# Patient Record
Sex: Female | Born: 1950 | Race: White | Hispanic: No | Marital: Married | Smoking: Never smoker
Health system: Southern US, Community
[De-identification: ages and names within clinical notes are randomized; demographics above are authoritative.]

## PROBLEM LIST (undated history)

## (undated) DIAGNOSIS — M26629 Arthralgia of temporomandibular joint, unspecified side: Secondary | ICD-10-CM

## (undated) DIAGNOSIS — K589 Irritable bowel syndrome without diarrhea: Secondary | ICD-10-CM

## (undated) DIAGNOSIS — N200 Calculus of kidney: Secondary | ICD-10-CM

## (undated) DIAGNOSIS — T7840XA Allergy, unspecified, initial encounter: Secondary | ICD-10-CM

## (undated) DIAGNOSIS — B009 Herpesviral infection, unspecified: Secondary | ICD-10-CM

## (undated) DIAGNOSIS — N3010 Interstitial cystitis (chronic) without hematuria: Secondary | ICD-10-CM

## (undated) HISTORY — DX: Arthralgia of temporomandibular joint, unspecified side: M26.629

## (undated) HISTORY — DX: Herpesviral infection, unspecified: B00.9

## (undated) HISTORY — PX: REDUCTION MAMMAPLASTY: SUR839

## (undated) HISTORY — DX: Interstitial cystitis (chronic) without hematuria: N30.10

## (undated) HISTORY — PX: ABDOMINAL HYSTERECTOMY: SHX81

## (undated) HISTORY — DX: Irritable bowel syndrome, unspecified: K58.9

## (undated) HISTORY — PX: BREAST ENHANCEMENT SURGERY: SHX7

## (undated) HISTORY — DX: Calculus of kidney: N20.0

## (undated) HISTORY — DX: Allergy, unspecified, initial encounter: T78.40XA

## (undated) HISTORY — PX: AUGMENTATION MAMMAPLASTY: SUR837

---

## 1994-10-09 HISTORY — PX: MICRODISCECTOMY LUMBAR: SUR864

## 2001-07-08 ENCOUNTER — Other Ambulatory Visit: Admission: RE | Admit: 2001-07-08 | Discharge: 2001-07-08 | Payer: Self-pay | Admitting: Obstetrics & Gynecology

## 2002-03-02 ENCOUNTER — Encounter: Payer: Self-pay | Admitting: Internal Medicine

## 2002-03-02 ENCOUNTER — Encounter: Admission: RE | Admit: 2002-03-02 | Discharge: 2002-03-02 | Payer: Self-pay | Admitting: Internal Medicine

## 2002-07-12 ENCOUNTER — Other Ambulatory Visit: Admission: RE | Admit: 2002-07-12 | Discharge: 2002-07-12 | Payer: Self-pay | Admitting: Obstetrics & Gynecology

## 2002-08-30 ENCOUNTER — Encounter: Admission: RE | Admit: 2002-08-30 | Discharge: 2002-08-30 | Payer: Self-pay | Admitting: Plastic Surgery

## 2002-08-30 ENCOUNTER — Encounter: Payer: Self-pay | Admitting: Plastic Surgery

## 2003-04-26 ENCOUNTER — Ambulatory Visit (HOSPITAL_COMMUNITY): Admission: RE | Admit: 2003-04-26 | Discharge: 2003-04-26 | Payer: Self-pay | Admitting: Internal Medicine

## 2003-04-26 ENCOUNTER — Encounter: Payer: Self-pay | Admitting: Internal Medicine

## 2003-08-09 ENCOUNTER — Other Ambulatory Visit: Admission: RE | Admit: 2003-08-09 | Discharge: 2003-08-09 | Payer: Self-pay | Admitting: Obstetrics and Gynecology

## 2003-08-17 ENCOUNTER — Encounter: Admission: RE | Admit: 2003-08-17 | Discharge: 2003-08-17 | Payer: Self-pay | Admitting: Obstetrics & Gynecology

## 2004-04-30 ENCOUNTER — Encounter (INDEPENDENT_AMBULATORY_CARE_PROVIDER_SITE_OTHER): Payer: Self-pay | Admitting: *Deleted

## 2004-04-30 ENCOUNTER — Observation Stay (HOSPITAL_COMMUNITY): Admission: AD | Admit: 2004-04-30 | Discharge: 2004-05-01 | Payer: Self-pay | Admitting: Obstetrics & Gynecology

## 2004-08-28 ENCOUNTER — Encounter: Admission: RE | Admit: 2004-08-28 | Discharge: 2004-08-28 | Payer: Self-pay | Admitting: Internal Medicine

## 2004-12-09 ENCOUNTER — Other Ambulatory Visit: Admission: RE | Admit: 2004-12-09 | Discharge: 2004-12-09 | Payer: Self-pay | Admitting: Obstetrics & Gynecology

## 2005-08-29 ENCOUNTER — Encounter: Admission: RE | Admit: 2005-08-29 | Discharge: 2005-08-29 | Payer: Self-pay | Admitting: Obstetrics & Gynecology

## 2006-09-03 ENCOUNTER — Encounter: Admission: RE | Admit: 2006-09-03 | Discharge: 2006-09-03 | Payer: Self-pay | Admitting: Internal Medicine

## 2006-09-03 ENCOUNTER — Encounter: Admission: RE | Admit: 2006-09-03 | Discharge: 2006-09-03 | Payer: Self-pay | Admitting: Obstetrics & Gynecology

## 2007-04-13 ENCOUNTER — Other Ambulatory Visit: Admission: RE | Admit: 2007-04-13 | Discharge: 2007-04-13 | Payer: Self-pay | Admitting: Internal Medicine

## 2007-07-05 ENCOUNTER — Emergency Department (HOSPITAL_COMMUNITY): Admission: EM | Admit: 2007-07-05 | Discharge: 2007-07-05 | Payer: Self-pay | Admitting: Emergency Medicine

## 2007-09-09 HISTORY — PX: COLONOSCOPY: SHX174

## 2007-09-20 ENCOUNTER — Encounter: Admission: RE | Admit: 2007-09-20 | Discharge: 2007-09-20 | Payer: Self-pay | Admitting: Obstetrics & Gynecology

## 2008-02-22 ENCOUNTER — Ambulatory Visit: Payer: Self-pay | Admitting: Gastroenterology

## 2008-03-06 ENCOUNTER — Ambulatory Visit: Payer: Self-pay | Admitting: Gastroenterology

## 2008-11-07 ENCOUNTER — Encounter: Admission: RE | Admit: 2008-11-07 | Discharge: 2008-11-07 | Payer: Self-pay | Admitting: Obstetrics & Gynecology

## 2008-12-21 ENCOUNTER — Ambulatory Visit: Payer: Self-pay | Admitting: Internal Medicine

## 2009-12-03 ENCOUNTER — Encounter: Admission: RE | Admit: 2009-12-03 | Discharge: 2009-12-03 | Payer: Self-pay | Admitting: Internal Medicine

## 2010-06-27 ENCOUNTER — Ambulatory Visit: Payer: Self-pay | Admitting: Internal Medicine

## 2010-10-31 ENCOUNTER — Ambulatory Visit (INDEPENDENT_AMBULATORY_CARE_PROVIDER_SITE_OTHER): Payer: BC Managed Care – PPO | Admitting: Internal Medicine

## 2010-10-31 DIAGNOSIS — L509 Urticaria, unspecified: Secondary | ICD-10-CM

## 2010-11-21 ENCOUNTER — Other Ambulatory Visit: Payer: Self-pay | Admitting: Internal Medicine

## 2010-11-21 DIAGNOSIS — Z1231 Encounter for screening mammogram for malignant neoplasm of breast: Secondary | ICD-10-CM

## 2010-12-10 ENCOUNTER — Ambulatory Visit: Payer: BC Managed Care – PPO

## 2010-12-16 ENCOUNTER — Ambulatory Visit
Admission: RE | Admit: 2010-12-16 | Discharge: 2010-12-16 | Disposition: A | Discharge status: Home / Self Care | Payer: BC Managed Care – PPO | Source: Ambulatory Visit | Attending: Internal Medicine | Admitting: Internal Medicine

## 2010-12-16 DIAGNOSIS — Z1231 Encounter for screening mammogram for malignant neoplasm of breast: Secondary | ICD-10-CM

## 2011-01-24 NOTE — Discharge Summary (Signed)
NAMEDORINNE, GRAEFF                            ACCOUNT NO.:  [id number]   MEDICAL RECORD NO.:  [id number]                   PATIENT TYPE:  OBV   LOCATION:  9317                                 FACILITY:  [facility]   PHYSICIAN:  Freddy Finner, M.D.                DATE OF BIRTH:  1951/05/22   DATE OF ADMISSION:  04/30/2004  DATE OF DISCHARGE:  05/01/2004                                 DISCHARGE SUMMARY   DISCHARGE DIAGNOSES:  1.  Extensive uterine adenomyosis.  2.  Serosal uterine endometriosis.  3.  Small uterine leiomyoma.   OPERATIVE PROCEDURE:  1.  Laparoscopically-assisted vaginal hysterectomy.  2.  Bilateral salpingo-oophorectomy.   POSTOPERATIVE COMPLICATIONS:  None.   DISPOSITION:  The patient is in satisfactory condition at the time of her  discharge.  She is to have progressively increasing physical activity but no  vaginal entry.  No heavy lifting.  She is report having vaginal bleeding or  severe pain or fever.  She is to return to the office in approximately 2  weeks for postoperative followup.  She is to take a regular diet.  She is  given Vivelle 0.1 dot for estrogen replacement.  She is given Percocet 5/325  to be taken as needed for postoperative pain along with Ibuprofen as needed.   Details of the present illness, past history, family history, review of  systems, and physical examination recorded in the Admission Note.   Her physical findings on admission were primarily remarkable for the uterine  enlargement of 8-9 weeks' gestational size.  Her history is remarkable for  persistent irregular menometrorrhagia on hormone replacement therapy and  family history of ovarian cancer with recent postmenopausal ovarian cyst  __________ .   HOSPITAL COURSE:  Following admission, the patient was taken to the  operating room.  She was treated perioperatively with compression hose and  IV antibiotic.  The above-described operative procedure was accomplished  without  difficulty or intraoperative complications.  The patient's  postoperative course was entirely satisfactory without complications.  The  laboratory data during this admission included an admission hemoglobin of  13.2.  Postoperative hemoglobin of 11.3.  Admission prothrombin time, PTT,  and urinalysis were all normal.  On the afternoon of the first postoperative  day, her condition was considered to be good.  She was discharged home with  disposition as noted above.                                               Freddy Finner, M.D.    WRN/MEDQ  D:  05/19/2004  T:  05/20/2004  Job:  [id number]

## 2011-01-24 NOTE — Op Note (Signed)
NAME:  Gabriela Green, Gabriela Green                            ACCOUNT NO.:  [id number]   MEDICAL RECORD NO.:  [id number]                   PATIENT TYPE:  OBV   LOCATION:  9399                                 [facility]   PHYSICIAN:  Freddy Finner, M.D.                DATE OF BIRTH:  02/02/1951   DATE OF PROCEDURE:  04/30/2004  DATE OF DISCHARGE:                                 OPERATIVE REPORT   PREOPERATIVE DIAGNOSES:  1. Uterine fibroids.  2. Persistent dysfunctional uterine bleeding on hormone replacement therapy.  3. Recurring evidence of postmenopausal ovarian cyst.  4. Family history of ovarian cancer.   POSTOPERATIVE DIAGNOSES:  1. Uterine fibroids.  2. Persistent dysfunctional uterine bleeding on hormone replacement therapy.  3. Recurring evidence of postmenopausal ovarian cyst.  4. Family history of ovarian cancer.   OPERATIVE PROCEDURE:  Laparoscopic-assisted vaginal hysterectomy, bilateral  salpingo-oophorectomy.   SURGEON:  Freddy Finner, M.D.   ASSISTANT:  Dineen Kid. Rana Snare, M.D.   ESTIMATED INTRAOPERATIVE BLOOD LOSS:  200 mL.   INTRAOPERATIVE COMPLICATIONS:  None.   The patient is a 60 year old married female who was admitted on the morning  of surgery.  She received a gram of Rocephin IV.  She was placed in PAS  hose.  She was brought to the operating room, there placed under adequate  general anesthesia, placed in the dorsal lithotomy position.  The abdomen,  perineum, and vagina were prepped in the usual fashion using Betadine scrub  followed by Betadine solution.  A Hulka tenaculum was attached to the cervix  under direct visualization without difficulty.  Sterile drapes were applied.  Two small incisions were made - one at the umbilicus, one just above the  symphysis.  The anterior abdominal wall was elevated manually and an 11 mm  nonbladed disposable trocar introduced at the umbilicus.  Direct inspection  revealed adequate placement with no evidence of injury on  entry.  Pneumoperitoneum was allowed to accumulate with carbon dioxide gas.  A 5 mm  trocar was placed through a small midline lower suprapubic incision.  Through it a blunt probe and later the grasping spring-loaded forceps were  used.  Systematic examination of pelvic and abdominal contents was carried  out.  The uterus was enlarged to approximately 8-[redacted] weeks gestational size.  Tubes and ovaries were normal.  There was no evidence of peritoneal disease.  The cecum was very redundant and appendix was visualized and was normal.  There was no apparent abnormality of liver or any other obvious abnormality  in the upper abdomen.  Findings were recorded in photographs which were  retained in the office record.  Using the tripolar Gyrus device and the  spring-loaded grasping forceps, the adnexa on the right was grasped and  placed under traction.  The infundibulopelvic ligament, round ligament,  upper broad ligament were progressively developed, sealed, and divided with  the Gyrus  device.  This was carried to a level just above the uterine  arteries.  The left side was treated then in an identical fashion.  Attention was then turned vaginally.  A posterior weighted vaginal retractor  was placed.  Deavers were used to retract the anterior and lateral vaginal  walls.  The cervix was grasped with a Jacobs tenaculum after removing the  Hulka tenaculum.  Colpotomy incision was made posteriorly while tenting the  mucosa posterior to the cervix.  The cervix was circumscribed with a scalpel  to release the mucosa and bladder.  The Gyrus Heaney-type clamp was then  used to seal and divide the uterosacrals on each side and then the bladder  pillars on each side.  The bladder was further advanced off the cervix, the  anterior peritoneum was entered, and this was confirmed by careful  inspection.  The Gyrus device was used to seal and then divide the cardinal  pedicles and the vessel pedicles.  This  essentially completely released the  uterus.  The angles of the vagina were then anchored to the uterosacrals  with mattress suture of 0 Monocryl.  The uterosacrals were plicated and  posterior peritoneum closed with an interrupted 0 Monocryl suture.  The cuff  was closed vertically in figure-of-eights of 0 Monocryl.  A Foley catheter  was placed.  Reinspection laparoscopically was then carried out using the  Nezhat irrigation system.  All pedicles were carefully evaluated and found  to be completely dry after irrigation and inspection under irrigation.  The  instruments were then removed, all the irrigating solution was aspirated,  the gas was allowed to escape from the abdomen.  The skin incisions were  closed with interrupted subcuticular sutures of 3-0 Dexon, Steri-Strips were  applied to the lower incision.  Marcaine 0.5% with epinephrine was injected  into each incision site for postoperative analgesia; a total of  approximately 9 mL of this solution was used.  The patient was awakened,  taken to the recovery room in good condition.                                               Freddy Finner, M.D.    WRN/MEDQ  D:  04/30/2004  T:  04/30/2004  Job:  [id number]

## 2011-01-24 NOTE — H&P (Signed)
NAME:  Gabriela Green, Gabriela Green                            ACCOUNT NO.:  [id number]   MEDICAL RECORD NO.:  [id number]                   PATIENT TYPE:  OBV   LOCATION:  NA                                   [facility]   PHYSICIAN:  Freddy Finner, M.D.                DATE OF BIRTH:  1950/12/21   DATE OF ADMISSION:  04/30/2004  DATE OF DISCHARGE:                                HISTORY & PHYSICAL   ADMITTING DIAGNOSES:  1. Uterine leiomyomata.  2. Persistent postmenopausal bleeding on hormone replacement therapy.  3. Family history of ovarian cancer with recent postmenopausal ovarian cyst.   HISTORY OF PRESENT ILLNESS:  Patient is a 60 year old white married female  gravida 4, para 2, living 2 who has been followed in the office since the  early 1980s.  She has been known to have uterine leiomyomata since at least  2001.  She has been on hormone replacement therapy for the last 4 years.  Various forms have been used including Vivelle skin patch with Prometrium  and more recently Activella.  She has had intermittent episodes of  postmenopausal bleeding with the hormone replacement therapy.  This has  failed to respond to cyclic hormonal therapy.  With the positive family  history of ovarian cancer, recent postmenopausal findings and ovarian cysts,  the patient has requested definitive surgical intervention.  She is admitted  now for laparoscopically assisted vaginal hysterectomy and bilateral  salpingo-oophorectomy.   REVIEW OF SYSTEMS:  Her current review of systems is otherwise negative.  She has no cardiopulmonary, GI, or GU complaints.   PAST MEDICAL HISTORY:  Patient has no known significant medical illnesses.  Episodically she has been felt to have mitral valve prolapse which has never  been definitively evaluated.  She has no other medical conditions.  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  She is not a  cigarette smoker.  She only occasionally uses alcohol.  She has never been a  smoker.  She has never had a blood transfusion.   FAMILY HISTORY:  Family history is remarkable for ovarian cancer, mother had  tuberculosis; no other documented significant family history.   PHYSICAL EXAMINATION:  HEENT:  Grossly within normal limits.  NECK:  Thyroid gland is not palpably enlarged.  VITAL SIGNS:  Blood pressure in the office is 100/60, weight is 141, height  5 feet 8 inches.  HEART:  Normal sinus rhythm without murmurs, rubs, or gallops heard to my  examination at preop physical.  LUNGS:  Lungs are clear to auscultation.  BREAST:  Breast exam is normal and remarkable for breast augmentation, there  are no discrete palpable masses, no skin change, no nipple discharge.  ABDOMEN:  Soft and nontender without appreciable organomegaly or palpable  masses.  PELVIC:  External genitalia, vagina, cervix are normal.  Bimanual reveals  the uterus to be retroverted in position approximately 8-[redacted] weeks gestational  size, there are no palpable adnexal masses at preoperative exam.  Rectum is  palpably normal and rectovaginal exam confirmed these findings.  EXTREMITIES:  Without cyanosis, clubbing, or edema.   ULTRASOUND:  Ultrasound in the office in March 2005 did show definite  uterine leiomyomata.   MAMMOGRAM:  Most recent mammogram was in December 2004 at the [facility]  of [place] and it was felt to be normal.   ASSESSMENT:  1. Uterine leiomyomata.  2. Ovarian cyst episodically in a postmenopausal female.  3. Family history of ovarian cancer.   PLAN:  Laparoscopically assisted vaginal hysterectomy/bilateral salpingo-  oophorectomy.  The patient has reviewed a video describing the operative  procedure and is now admitted for surgery.  She will be treated  perioperatively with IV antibiotic with compression hose for thromboembolic  prophylaxis.                                                Freddy Finner, M.D.    WRN/MEDQ  D:  04/29/2004  T:  04/29/2004  Job:  [id number]

## 2011-05-02 ENCOUNTER — Encounter: Payer: Self-pay | Admitting: Internal Medicine

## 2011-05-06 ENCOUNTER — Encounter: Payer: BC Managed Care – PPO | Admitting: Internal Medicine

## 2011-05-20 ENCOUNTER — Encounter: Payer: Self-pay | Admitting: Internal Medicine

## 2011-05-20 ENCOUNTER — Ambulatory Visit (INDEPENDENT_AMBULATORY_CARE_PROVIDER_SITE_OTHER): Payer: BC Managed Care – PPO | Admitting: Internal Medicine

## 2011-05-20 VITALS — BP 112/62 | HR 68 | Temp 97.6°F | Ht 68.0 in | Wt 145.0 lb

## 2011-05-20 DIAGNOSIS — Z0000 Encounter for general adult medical examination without abnormal findings: Secondary | ICD-10-CM

## 2011-05-20 DIAGNOSIS — Z23 Encounter for immunization: Secondary | ICD-10-CM

## 2011-05-20 LAB — POCT URINALYSIS DIPSTICK
Glucose, UA: NEGATIVE
Leukocytes, UA: NEGATIVE
Protein, UA: NEGATIVE
Urobilinogen, UA: NEGATIVE

## 2011-05-20 LAB — CBC WITH DIFFERENTIAL/PLATELET
Eosinophils Absolute: 0.3 10*3/uL (ref 0.0–0.7)
Eosinophils Relative: 7 % — ABNORMAL HIGH (ref 0–5)
Hemoglobin: 13.3 g/dL (ref 12.0–15.0)
Lymphs Abs: 1.5 10*3/uL (ref 0.7–4.0)
MCH: 31.4 pg (ref 26.0–34.0)
MCV: 92.9 fL (ref 78.0–100.0)
Monocytes Relative: 9 % (ref 3–12)
Platelets: 305 10*3/uL (ref 150–400)
RBC: 4.23 MIL/uL (ref 3.87–5.11)
WBC: 4.9 10*3/uL (ref 4.0–10.5)

## 2011-05-20 LAB — COMPREHENSIVE METABOLIC PANEL
Albumin: 4.1 g/dL (ref 3.5–5.2)
BUN: 8 mg/dL (ref 6–23)
CO2: 25 mEq/L (ref 19–32)
Calcium: 9.1 mg/dL (ref 8.4–10.5)
Chloride: 104 mEq/L (ref 96–112)
Creat: 0.65 mg/dL (ref 0.50–1.10)
Potassium: 4.3 mEq/L (ref 3.5–5.3)

## 2011-05-20 LAB — LIPID PANEL
HDL: 70 mg/dL (ref >39)
LDL Cholesterol: 97 mg/dL (ref 0–99)
Total CHOL/HDL Ratio: 2.7 Ratio

## 2011-05-21 LAB — VITAMIN D 25 HYDROXY (VIT D DEFICIENCY, FRACTURES): Vit D, 25-Hydroxy: 20 ng/mL — ABNORMAL LOW (ref 30–89)

## 2011-05-23 ENCOUNTER — Other Ambulatory Visit: Payer: Self-pay | Admitting: Dermatology

## 2011-06-04 DIAGNOSIS — M8580 Other specified disorders of bone density and structure, unspecified site: Secondary | ICD-10-CM | POA: Insufficient documentation

## 2011-06-04 NOTE — Progress Notes (Deleted)
  Subjective:    Patient ID: Gabriela Green, female    DOB: 1951-01-09, 60 y.o.   MRN: [id number]  HPI 60 year old white female with history of hypertension and anxiety for health maintenance. Had Zostavax vaccine August 2011, bone density may 2009, Cardiolite study 2006. GYN physician is Dr. Eda Paschal. Patient had mammogram August 2011. History of depression 1998. Takes HCTZ for mild hypertension with good control. Keep her weight under control and exercises. Is retired from [employer]. and lives in [place] with her [place] . 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 Benna Dunks in 2010. Needs to consider bone density in the near future. Last bone density study showed a T score of the femoral neck -2.4 and left hip -1.3. At that time recommended maximizing calcium and vitamin D intake.  Family history uncle with coronary artery disease, mother with coronary artery disease and dementia. Father died at age 23 with a stroke. Patient has no history of serious illnesses accidents or operations. No known drug allergies.    Review of Systems  Constitutional: Negative.   HENT: Negative.   Eyes: Negative.   Respiratory: Negative.   Gastrointestinal: Negative.   Genitourinary: Negative.   Musculoskeletal: Negative.   Neurological: Negative.   Hematological: Negative.   Psychiatric/Behavioral: Negative.        Objective:   Physical Exam  Vitals reviewed. Constitutional: She is oriented to person, place, and time. She appears well-nourished. No distress.  HENT:  Head: Normocephalic and atraumatic.  Right Ear: External ear normal.  Left Ear: External ear normal.  Mouth/Throat: Oropharynx is clear and moist.  Eyes: Conjunctivae and EOM are normal. Pupils are equal, round, and reactive to light. Right eye exhibits no  discharge. Left eye exhibits no discharge. No scleral icterus.  Neck: Neck supple. No JVD present. No thyromegaly present.  Cardiovascular: Normal rate, regular rhythm, normal heart sounds and intact distal pulses.   No murmur heard. Pulmonary/Chest: Effort normal and breath sounds normal. She has no wheezes. She has no rales.  Abdominal: She exhibits no distension and no mass. There is no tenderness. There is no rebound and no guarding.  Genitourinary:       Deferred to GYN  Musculoskeletal: Normal range of motion. She exhibits no edema.  Lymphadenopathy:    She has no cervical adenopathy.  Neurological: She is alert and oriented to person, place, and time. She has normal reflexes. No cranial nerve deficit.  Skin: Skin is warm and dry. No rash noted.  Psychiatric: She has a normal mood and affect. Her behavior is normal. Judgment and thought content normal.          Assessment & Plan:  Hypertension  Anxiety  Remote history of depression  Osteopenia  Patient is to return in one year or as needed. Consider bone density study next year. Continue calcium and vitamin D supplementation.

## 2011-06-09 ENCOUNTER — Encounter: Payer: Self-pay | Admitting: Internal Medicine

## 2011-06-09 NOTE — Progress Notes (Signed)
  Subjective:    Patient ID: Gabriela Green, female    DOB: [birth year], 60 y.o.   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 History of allergic rhinitis. 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 Takes calcium and multivitamin supplements. Has never smoked. Social alcohol consumption. Have breast implants 1998. Hysterectomy and BSO August 2005. 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 Had tetanus immunization 2004. Colonoscopy by Dr. Jarold Motto 2009. Mammogram April 2012.  Divorced with 2 adult daughters from her first marriage.  Family history: Father died with pneumonia. 2 brothers and 2 sisters. One sister with diabetes.    Review of Systems no significant changes in weight. No cough or chest pain. No urinary issues. No abdominal pain. No change in bowel movements. No significant joint pain.     Objective:   Physical Exam TMs are clear; pharynx is clear; neck is supple without JVD, thyromegaly, will or carotid bruits. Chest clear to auscultation; cardiac exam: regular rate and rhythm normal S1 and S2; abdomen: bowel sounds are active without masses or tenderness or organomegaly. Extremities without deformity; neuro no focal deficits on brief neurological exam.        Assessment & Plan:  Normal health maintenance exam  Return one year or as needed

## 2011-06-18 LAB — POCT CARDIAC MARKERS: CKMB, poc: <1 — ABNORMAL LOW

## 2011-11-21 ENCOUNTER — Other Ambulatory Visit: Payer: Self-pay | Admitting: Internal Medicine

## 2011-11-21 DIAGNOSIS — Z1231 Encounter for screening mammogram for malignant neoplasm of breast: Secondary | ICD-10-CM

## 2012-01-15 ENCOUNTER — Ambulatory Visit: Payer: BC Managed Care – PPO

## 2012-01-16 DIAGNOSIS — H40019 Open angle with borderline findings, low risk, unspecified eye: Secondary | ICD-10-CM | POA: Insufficient documentation

## 2012-01-30 ENCOUNTER — Ambulatory Visit: Payer: BC Managed Care – PPO

## 2012-02-10 ENCOUNTER — Ambulatory Visit
Admission: RE | Admit: 2012-02-10 | Discharge: 2012-02-10 | Disposition: A | Discharge status: Home / Self Care | Payer: BC Managed Care – PPO | Source: Ambulatory Visit | Attending: Internal Medicine | Admitting: Internal Medicine

## 2012-02-10 DIAGNOSIS — Z1231 Encounter for screening mammogram for malignant neoplasm of breast: Secondary | ICD-10-CM

## 2012-06-24 ENCOUNTER — Other Ambulatory Visit: Payer: BC Managed Care – PPO | Admitting: Internal Medicine

## 2012-06-25 ENCOUNTER — Encounter: Payer: BC Managed Care – PPO | Admitting: Internal Medicine

## 2012-09-21 ENCOUNTER — Other Ambulatory Visit: Payer: BC Managed Care – PPO | Admitting: Internal Medicine

## 2012-09-21 DIAGNOSIS — Z0000 Encounter for general adult medical examination without abnormal findings: Secondary | ICD-10-CM

## 2012-09-21 LAB — COMPREHENSIVE METABOLIC PANEL
Albumin: 3.9 g/dL (ref 3.5–5.2)
Alkaline Phosphatase: 32 U/L — ABNORMAL LOW (ref 39–117)
BUN: 13 mg/dL (ref 6–23)
Calcium: 9.2 mg/dL (ref 8.4–10.5)
Creat: 0.58 mg/dL (ref 0.50–1.10)
Glucose, Bld: 81 mg/dL (ref 70–99)
Potassium: 4.5 mEq/L (ref 3.5–5.3)

## 2012-09-21 LAB — LIPID PANEL
HDL: 63 mg/dL (ref >39)
LDL Cholesterol: 77 mg/dL (ref 0–99)
Total CHOL/HDL Ratio: 2.5 Ratio
Triglycerides: 93 mg/dL (ref <150)
VLDL: 19 mg/dL (ref 0–40)

## 2012-09-21 LAB — CBC WITH DIFFERENTIAL/PLATELET
Basophils Absolute: 0.1 10*3/uL (ref 0.0–0.1)
Basophils Relative: 1 % (ref 0–1)
Eosinophils Absolute: 0.1 10*3/uL (ref 0.0–0.7)
Eosinophils Relative: 2 % (ref 0–5)
HCT: 38.9 % (ref 36.0–46.0)
Hemoglobin: 13.2 g/dL (ref 12.0–15.0)
Lymphocytes Relative: 32 % (ref 12–46)
Lymphs Abs: 1.4 10*3/uL (ref 0.7–4.0)
MCH: 31 pg (ref 26.0–34.0)
MCHC: 33.9 g/dL (ref 30.0–36.0)
MCV: 91.3 fL (ref 78.0–100.0)
Monocytes Absolute: 0.4 10*3/uL (ref 0.1–1.0)
Monocytes Relative: 9 % (ref 3–12)
Neutro Abs: 2.4 10*3/uL (ref 1.7–7.7)
Neutrophils Relative %: 56 % (ref 43–77)
Platelets: 305 10*3/uL (ref 150–400)
RBC: 4.26 MIL/uL (ref 3.87–5.11)
RDW: 12.6 % (ref 11.5–15.5)
WBC: 4.3 10*3/uL (ref 4.0–10.5)

## 2012-09-22 LAB — VITAMIN D 25 HYDROXY (VIT D DEFICIENCY, FRACTURES): Vit D, 25-Hydroxy: 27 ng/mL — ABNORMAL LOW (ref 30–89)

## 2012-09-23 ENCOUNTER — Ambulatory Visit (INDEPENDENT_AMBULATORY_CARE_PROVIDER_SITE_OTHER): Payer: BC Managed Care – PPO | Admitting: Internal Medicine

## 2012-09-23 ENCOUNTER — Encounter: Payer: Self-pay | Admitting: Internal Medicine

## 2012-09-23 VITALS — BP 110/76 | HR 76 | Temp 97.5°F | Ht 67.5 in | Wt 142.0 lb

## 2012-09-23 DIAGNOSIS — Z0000 Encounter for general adult medical examination without abnormal findings: Secondary | ICD-10-CM

## 2012-09-23 DIAGNOSIS — Z8739 Personal history of other diseases of the musculoskeletal system and connective tissue: Secondary | ICD-10-CM

## 2012-09-23 LAB — POCT URINALYSIS DIPSTICK
Bilirubin, UA: NEGATIVE
Blood, UA: NEGATIVE
Ketones, UA: NEGATIVE
pH, UA: 6.5

## 2012-10-07 ENCOUNTER — Telehealth: Payer: Self-pay | Admitting: Obstetrics and Gynecology

## 2012-10-07 NOTE — Telephone Encounter (Signed)
TC to pt re: Rf request for Progesterone Rf.  LM to return call.

## 2012-10-14 ENCOUNTER — Telehealth: Payer: Self-pay | Admitting: Internal Medicine

## 2012-10-14 ENCOUNTER — Encounter: Payer: Self-pay | Admitting: Obstetrics and Gynecology

## 2012-10-14 ENCOUNTER — Ambulatory Visit: Payer: BC Managed Care – PPO | Admitting: Obstetrics and Gynecology

## 2012-10-14 VITALS — BP 100/64 | HR 70 | Ht 67.0 in | Wt 145.0 lb

## 2012-10-14 DIAGNOSIS — Z01419 Encounter for gynecological examination (general) (routine) without abnormal findings: Secondary | ICD-10-CM

## 2012-10-14 MED ORDER — ACYCLOVIR 200 MG PO CAPS: ORAL_CAPSULE | ORAL | Status: DC

## 2012-10-14 MED ORDER — ESTRADIOL-NORETHINDRONE ACET 1-0.5 MG PO TABS: 1.0000 | ORAL_TABLET | Freq: Every day | ORAL | Status: DC

## 2012-10-14 NOTE — Telephone Encounter (Signed)
Patient informed. Given the phone number and website  For [employer].

## 2012-10-14 NOTE — Telephone Encounter (Signed)
Last had tetanus vaccine June 1004 so due for one June 2014. Should call for appt at either [facility] Dept, Occupational Health at [facility], [place] or Travel Medicine Office near  [place] is it called  ?Passport? For trip to [place] and be specific about places traveling to with them.

## 2012-10-14 NOTE — Progress Notes (Signed)
Subjective:    Gabriela Green is a 62 y.o. female, G4P0, who presents for an annual exam. The patient reports no problems but occasional constipation.  Menstrual cycle:   LMP: No LMP recorded. Patient has had a hysterectomy.             Review of Systems Pertinent items are noted in HPI. Denies pelvic pain, urinary tract symptoms, vaginitis symptoms, irregular bleeding, menopausal symptoms, change in bowel habits or rectal bleeding   Objective:    BP 100/64  Pulse 70  Ht 5\' 7"  (1.702 m)  Wt 145 lb (65.772 kg)  BMI 22.71 kg/m2    Wt Readings from Last 1 Encounters:  10/14/12 145 lb (65.772 kg)   Body mass index is 22.71 kg/(m^2). General Appearance: Alert, no acute distress HEENT: Grossly normal Neck / Thyroid: Supple, no thyromegaly or cervical adenopathy Lungs: Clear to auscultation bilaterally Back: No CVA tenderness Breast Exam: No masses or nodes.No dimpling, nipple retraction or discharge. Cardiovascular: Regular rate and rhythm.  Gastrointestinal: Soft, non-tender, no masses or organomegaly Pelvic Exam: EGBUS-wnl, vagina-normal rugae, cervix/cervix-surgically absent, adnexae-no masses or tenderness Rectovaginal: no masses and normal sphincter tone Lymphatic Exam: Non-palpable nodes in neck, clavicular,  axillary, or inguinal regions  Skin: no rashes or abnormalities Extremities: no clubbing cyanosis or edema  Neurologic: grossly normal Psychiatric: Alert and oriented  Assessment:   Routine GYN Exam   Plan:    PAP sent  Acyclovir 400 mg  #30 tid x 5 days prn 11 refills  Activella #1 month 1 po qd 11 refills  Progesterone Cream refilled  Bowel hygiene for occasional constipation  RTO 1 year or prn  Gabriela Green,ELMIRAPA-C

## 2012-10-14 NOTE — Progress Notes (Signed)
Regular Periods: no Mammogram: yes  Monthly Breast Ex.: no Exercise: yes  Tetanus < 10 years: yes Seatbelts: yes  NI. Bladder Functn.: yes Abuse at home: no  Daily BM's: no bowel changes Stressful Work: yes  Healthy Diet: yes Sigmoid-Colonoscopy: within last 5 years  Calcium: yes Medical problems this year: bowel changes and want to know should she do another salival test   LAST PAP:1/13   nl  Contraception: pm  Mammogram:  2013  PCP: DR. BAXLEY  PMH: NO CHANGE  FMH: NO CHANGE  Last Bone Scan: LONG TIME  PT IS SINGLE

## 2012-11-08 ENCOUNTER — Other Ambulatory Visit: Payer: Self-pay | Admitting: Obstetrics and Gynecology

## 2012-11-08 MED ORDER — ESTRADIOL 1 MG PO TABS: 1.0000 mg | ORAL_TABLET | Freq: Every day | ORAL | Status: AC

## 2012-11-08 NOTE — Progress Notes (Signed)
Call to patient to clarify her prescriptions.  At the last visit she was mistakenly placed on Activella as this was an old prescription.  Patient has had a hysterectomy and is taking estradiol 1 mg and progesterone Cream instead.  She did not pick up the Activella patches and instead continued with her previously prescribed medication. Needs refill on estradiol 1 mg qd 11 refills.   POWELL,ELMIRA, PA-C

## 2012-11-14 ENCOUNTER — Encounter: Payer: Self-pay | Admitting: Internal Medicine

## 2012-11-14 NOTE — Progress Notes (Signed)
  Subjective:    Patient ID: Gabriela Green, female    DOB: [birth year], 62 y.o.   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 Patient has history of osteopenia and herpes simplex type II with rare outbreaks. History of allergic rhinitis.  Patient had breast implants 1998. Hysterectomy BSO August 2005. Herniated disc L5-S1 the left with microdiscectomy done February 1996.  Nonsmoker. Social alcohol consumption.  Tetanus immunization 2004. Colonoscopy by Dr. Jarold Motto 2009.  Social history: Divorced with 2 adult daughters. 2 marriages.  Family history: Father died with pneumonia. 2 brothers and 2 sisters. One sister with diabetes.  Last mammogram 02/12/2012. No recent bone density study.    Review of Systems  Constitutional: Negative.   All other systems reviewed and are negative.       Objective:   Physical Exam  Vitals reviewed. Constitutional: She is oriented to person, place, and time. She appears well-developed and well-nourished. No distress.  HENT:  Head: Normocephalic and atraumatic.  Right Ear: External ear normal.  Left Ear: External ear normal.  Mouth/Throat: Oropharynx is clear and moist. No oropharyngeal exudate.  Eyes: Conjunctivae and EOM are normal. Pupils are equal, round, and reactive to light. Right eye exhibits no discharge. Left eye exhibits no discharge. No scleral icterus.  Neck: Neck supple. No JVD present. No thyromegaly present.  Cardiovascular: Normal rate, regular rhythm, normal heart sounds and intact distal pulses.   No murmur heard. Pulmonary/Chest: Effort normal and breath sounds normal. No respiratory distress. She has no wheezes. She has no rales.  Bilateral breast implants  Abdominal: Soft. Bowel sounds are normal. She exhibits no distension and no mass. There is no tenderness. There is no rebound and no guarding.  Genitourinary:  Deferred to GYN  Musculoskeletal: Normal range  of motion. She exhibits no edema.  Lymphadenopathy:    She has no cervical adenopathy.  Neurological: She is alert and oriented to person, place, and time. She has normal reflexes. No cranial nerve deficit.  Skin: Skin is warm and dry. No rash noted. She is not diaphoretic.  Psychiatric: Her behavior is normal. Judgment and thought content normal.          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 Lab work reviewed. Recommend bone density study and Zostavax vaccine

## 2012-11-14 NOTE — Patient Instructions (Addendum)
Return in one year.

## 2013-01-14 ENCOUNTER — Other Ambulatory Visit: Payer: Self-pay

## 2013-01-14 DIAGNOSIS — Z1231 Encounter for screening mammogram for malignant neoplasm of breast: Secondary | ICD-10-CM

## 2013-03-22 ENCOUNTER — Ambulatory Visit
Admission: RE | Admit: 2013-03-22 | Discharge: 2013-03-22 | Disposition: A | Discharge status: Home / Self Care | Payer: BC Managed Care – PPO | Source: Ambulatory Visit

## 2013-03-22 DIAGNOSIS — Z1231 Encounter for screening mammogram for malignant neoplasm of breast: Secondary | ICD-10-CM

## 2013-07-12 ENCOUNTER — Encounter: Payer: Self-pay | Admitting: Internal Medicine

## 2013-07-12 ENCOUNTER — Ambulatory Visit (INDEPENDENT_AMBULATORY_CARE_PROVIDER_SITE_OTHER): Payer: BC Managed Care – PPO | Admitting: Internal Medicine

## 2013-07-12 VITALS — BP 108/68 | HR 68 | Temp 96.5°F | Ht 67.0 in | Wt 141.0 lb

## 2013-07-12 DIAGNOSIS — J019 Acute sinusitis, unspecified: Secondary | ICD-10-CM

## 2013-07-12 DIAGNOSIS — J209 Acute bronchitis, unspecified: Secondary | ICD-10-CM

## 2013-07-12 MED ORDER — LEVOFLOXACIN 500 MG PO TABS: 500.0000 mg | ORAL_TABLET | Freq: Every day | ORAL | Status: DC

## 2013-07-12 NOTE — Patient Instructions (Addendum)
Take Levaquin as directed with a meal. Take Tussicaps at Night for Cough. Otherwise Use Tessalon Perles As Needed during the Daytime for Cough. Enjoy your Trip.

## 2013-07-12 NOTE — Progress Notes (Signed)
  Subjective:    Patient ID: Gabriela Green, female    DOB: [birth year], 62 y.o.   MRN: [id number]  HPI 62 year old white female leaving for [place] tomorrow for 10 day trip. Has had URI symptoms with coughing for about 4 days. No fever or shaking chills. Sounds hoarse when she speaks. Coughing is particularly bothersome at night. No sputum production. Initially had slight sore throat but does not have that now    Review of Systems     Objective:   Physical Exam pharynx is slightly injected without exudate. TMs are clear. Neck is supple without adenopathy. Chest clear to auscultation. Sounds nasally congested and is coughing continuously in the office.       Assessment & Plan:  Sinusitis  Acute bronchitis  Plan: Levaquin 500 mg daily for 10 days. Tessalon Perles 200 mg 3 times daily as needed for cough. Tussi caps (Tussionex in caplet form) #10 one by mouth each bedtime when necessary cough.

## 2013-09-06 ENCOUNTER — Encounter: Payer: Self-pay | Admitting: Internal Medicine

## 2013-09-06 ENCOUNTER — Ambulatory Visit (INDEPENDENT_AMBULATORY_CARE_PROVIDER_SITE_OTHER): Payer: BC Managed Care – PPO | Admitting: Internal Medicine

## 2013-09-06 VITALS — BP 132/80 | HR 88 | Temp 98.7°F | Wt 143.0 lb

## 2013-09-06 DIAGNOSIS — H6593 Unspecified nonsuppurative otitis media, bilateral: Secondary | ICD-10-CM

## 2013-09-06 DIAGNOSIS — H9319 Tinnitus, unspecified ear: Secondary | ICD-10-CM

## 2013-09-06 DIAGNOSIS — J069 Acute upper respiratory infection, unspecified: Secondary | ICD-10-CM

## 2013-09-06 DIAGNOSIS — H9313 Tinnitus, bilateral: Secondary | ICD-10-CM

## 2013-09-06 DIAGNOSIS — H6590 Unspecified nonsuppurative otitis media, unspecified ear: Secondary | ICD-10-CM

## 2013-09-06 MED ORDER — BENZONATATE 200 MG PO CAPS: 200.0000 mg | ORAL_CAPSULE | Freq: Three times a day (TID) | ORAL | Status: DC | PRN

## 2013-09-06 MED ORDER — AZITHROMYCIN 250 MG PO TABS: ORAL_TABLET | ORAL | Status: DC

## 2013-09-06 NOTE — Progress Notes (Signed)
   Subjective:    Patient ID: Gabriela Green, female    DOB: Apr 15, 1951, 62 y.o.   MRN: [id number]  HPI  Four-day history of cough and congestion. No fever or shaking chills. Cough is not productive. Cough is deep. Also since traveling to [place], or so ago has developed bilateral ringing in her ears. Denies sore throat. No myalgias. No nausea or vomiting.  Is coming for physical exam at the end of January. We'll address tetanus immunization update at that time. Will defer influenza vaccine today because of illness. She may get one it pharmacy in 2 weeks as we are out. We will give her prescription to obtain Zostavax vaccine it pharmacy at and physical exam in late January.    Review of Systems     Objective:   Physical Exam skin is warm and dry. TMs are full bilaterally but not red. Pharynx only very slightly injected without exudate. Neck is supple without adenopathy. Chest clear to auscultation without rales or wheezing        Assessment & Plan:  Acute URI  Tinnitus-new onset over the past month. Likely due to bilateral serous otitis media  Bilateral serous otitis media  Plan: Zithromax Z-Pak take as directed. Tussionex suspension 1 teaspoon by mouth every 12 hours when necessary cough. Mainly take Tussionex at bedtime. Tessalon Perles 200 mg 3 times a day when necessary cough. May take decongestant for tinnitus. Reassess at time of physical exam late January.

## 2013-09-06 NOTE — Patient Instructions (Signed)
Take Tessalon Perles as directed. Take Tussionex at bedtime for cough. Take Zithromax Z-PAK as directed. Take decongestant for tinnitus

## 2013-09-09 ENCOUNTER — Ambulatory Visit
Admission: RE | Admit: 2013-09-09 | Discharge: 2013-09-09 | Disposition: A | Discharge status: Home / Self Care | Payer: BC Managed Care – PPO | Source: Ambulatory Visit | Attending: Internal Medicine | Admitting: Internal Medicine

## 2013-09-09 ENCOUNTER — Telehealth: Payer: Self-pay

## 2013-09-09 ENCOUNTER — Encounter: Payer: Self-pay | Admitting: Internal Medicine

## 2013-09-09 ENCOUNTER — Ambulatory Visit (INDEPENDENT_AMBULATORY_CARE_PROVIDER_SITE_OTHER): Payer: BC Managed Care – PPO | Admitting: Internal Medicine

## 2013-09-09 ENCOUNTER — Telehealth: Payer: Self-pay | Admitting: Internal Medicine

## 2013-09-09 VITALS — BP 100/66 | HR 84 | Temp 98.3°F | Wt 141.0 lb

## 2013-09-09 DIAGNOSIS — R059 Cough, unspecified: Secondary | ICD-10-CM

## 2013-09-09 DIAGNOSIS — R05 Cough: Secondary | ICD-10-CM

## 2013-09-09 DIAGNOSIS — J209 Acute bronchitis, unspecified: Secondary | ICD-10-CM

## 2013-09-09 DIAGNOSIS — J111 Influenza due to unidentified influenza virus with other respiratory manifestations: Secondary | ICD-10-CM

## 2013-09-09 MED ORDER — LEVOFLOXACIN 500 MG PO TABS: 500.0000 mg | ORAL_TABLET | Freq: Every day | ORAL | Status: DC

## 2013-09-09 NOTE — Progress Notes (Signed)
   Subjective:    Patient ID: Gabriela Green, female    DOB: Feb 23, 1951, 63 y.o.   MRN: [id number]  HPI Was seen December 30 with four-day history of cough and congestion. No documented fever chills or myalgias at the time. Was placed on a Zithromax Z-Pak and narcotic cough medication. Called today saying cough was worse and she felt awful. Was complaining of myalgias and headache along with cough. We had her go for chest x-ray that showed no evidence of pneumonia. Says she can barely get out of bed because of weakness and myalgias.    Review of Systems     Objective:   Physical Exam she looks pale and fatigued. TMs are slightly full bilaterally. Pharynx is clear. Neck is supple. Chest clear.        Assessment & Plan:  Likely has influenza. It is not appropriate to prescribe Tamiflu at this point since she's been ill for several days.  Plan: Change from Zithromax to Levaquin 500 milligrams daily for 7 days. Continue narcotic cough medication at least twice daily. This will help of myalgias and cough. May take Advil or Tylenol for headache as well. Has appointment next week for physical examination and fasting lab work.

## 2013-09-09 NOTE — Patient Instructions (Addendum)
Take Levaquin as directed. Take cough syrup for cough and myalgias. Return next week for physical exam and fasting lab work.

## 2013-09-09 NOTE — Telephone Encounter (Signed)
Gabriela Green called patient and instructed her to go for chest x-ray today and then be here at the office for appointment this afternoon.  Patient confirmed.

## 2013-09-09 NOTE — Telephone Encounter (Signed)
Go have CXR and come in this afternoon for OV to be worked in

## 2013-09-09 NOTE — Telephone Encounter (Signed)
Patient is to have a chest X-ray before coming over for an OV with Dr. Renold Genta

## 2013-10-03 ENCOUNTER — Other Ambulatory Visit: Payer: BC Managed Care – PPO | Admitting: Internal Medicine

## 2013-10-03 DIAGNOSIS — Z0000 Encounter for general adult medical examination without abnormal findings: Secondary | ICD-10-CM

## 2013-10-03 DIAGNOSIS — Z13228 Encounter for screening for other metabolic disorders: Secondary | ICD-10-CM

## 2013-10-03 DIAGNOSIS — Z1329 Encounter for screening for other suspected endocrine disorder: Secondary | ICD-10-CM

## 2013-10-03 DIAGNOSIS — Z130 Encounter for screening for diseases of the blood and blood-forming organs and certain disorders involving the immune mechanism: Secondary | ICD-10-CM

## 2013-10-03 DIAGNOSIS — Z13220 Encounter for screening for lipoid disorders: Secondary | ICD-10-CM

## 2013-10-03 LAB — LIPID PANEL
CHOL/HDL RATIO: 2.7 ratio
Cholesterol: 174 mg/dL (ref 0–200)
HDL: 64 mg/dL (ref >39)
LDL CALC: 90 mg/dL (ref 0–99)
Triglycerides: 101 mg/dL (ref <150)
VLDL: 20 mg/dL (ref 0–40)

## 2013-10-03 LAB — COMPREHENSIVE METABOLIC PANEL
ALK PHOS: 33 U/L — AB (ref 39–117)
ALT: 14 U/L (ref 0–35)
AST: 16 U/L (ref 0–37)
Albumin: 4 g/dL (ref 3.5–5.2)
BILIRUBIN TOTAL: 0.7 mg/dL (ref 0.3–1.2)
BUN: 13 mg/dL (ref 6–23)
CO2: 29 mEq/L (ref 19–32)
CREATININE: 0.63 mg/dL (ref 0.50–1.10)
Calcium: 9 mg/dL (ref 8.4–10.5)
Chloride: 101 mEq/L (ref 96–112)
Glucose, Bld: 104 mg/dL — ABNORMAL HIGH (ref 70–99)
Potassium: 4.1 mEq/L (ref 3.5–5.3)
Sodium: 138 mEq/L (ref 135–145)
Total Protein: 6.5 g/dL (ref 6.0–8.3)

## 2013-10-03 LAB — CBC WITH DIFFERENTIAL/PLATELET
BASOS ABS: 0 10*3/uL (ref 0.0–0.1)
BASOS PCT: 1 % (ref 0–1)
EOS PCT: 2 % (ref 0–5)
Eosinophils Absolute: 0.1 10*3/uL (ref 0.0–0.7)
HCT: 37.5 % (ref 36.0–46.0)
Hemoglobin: 12.9 g/dL (ref 12.0–15.0)
Lymphocytes Relative: 30 % (ref 12–46)
Lymphs Abs: 1.2 10*3/uL (ref 0.7–4.0)
MCH: 30.9 pg (ref 26.0–34.0)
MCHC: 34.4 g/dL (ref 30.0–36.0)
MCV: 89.7 fL (ref 78.0–100.0)
Monocytes Absolute: 0.4 10*3/uL (ref 0.1–1.0)
Monocytes Relative: 9 % (ref 3–12)
Neutro Abs: 2.4 10*3/uL (ref 1.7–7.7)
Neutrophils Relative %: 58 % (ref 43–77)
Platelets: 283 10*3/uL (ref 150–400)
RBC: 4.18 MIL/uL (ref 3.87–5.11)
RDW: 13.1 % (ref 11.5–15.5)
WBC: 4.1 10*3/uL (ref 4.0–10.5)

## 2013-10-03 LAB — TSH: TSH: 2.311 u[IU]/mL (ref 0.350–4.500)

## 2013-10-04 ENCOUNTER — Ambulatory Visit (INDEPENDENT_AMBULATORY_CARE_PROVIDER_SITE_OTHER): Payer: BC Managed Care – PPO | Admitting: Internal Medicine

## 2013-10-04 ENCOUNTER — Encounter: Payer: Self-pay | Admitting: Internal Medicine

## 2013-10-04 VITALS — BP 108/76 | HR 76 | Temp 98.4°F | Ht 67.25 in | Wt 141.0 lb

## 2013-10-04 DIAGNOSIS — H6503 Acute serous otitis media, bilateral: Secondary | ICD-10-CM

## 2013-10-04 DIAGNOSIS — Z0000 Encounter for general adult medical examination without abnormal findings: Secondary | ICD-10-CM

## 2013-10-04 DIAGNOSIS — H6500 Acute serous otitis media, unspecified ear: Secondary | ICD-10-CM

## 2013-10-04 DIAGNOSIS — H9319 Tinnitus, unspecified ear: Secondary | ICD-10-CM

## 2013-10-04 DIAGNOSIS — Z23 Encounter for immunization: Secondary | ICD-10-CM

## 2013-10-04 LAB — POCT URINALYSIS DIPSTICK
Bilirubin, UA: NEGATIVE
GLUCOSE UA: NEGATIVE
Ketones, UA: NEGATIVE
Leukocytes, UA: NEGATIVE
Nitrite, UA: NEGATIVE
PROTEIN UA: NEGATIVE
RBC UA: NEGATIVE
Spec Grav, UA: 1.01
UROBILINOGEN UA: NEGATIVE
pH, UA: 6.5

## 2013-10-04 LAB — VITAMIN D 25 HYDROXY (VIT D DEFICIENCY, FRACTURES): Vit D, 25-Hydroxy: 64 ng/mL (ref 30–89)

## 2013-10-04 MED ORDER — TETANUS-DIPHTH-ACELL PERTUSSIS 5-2.5-18.5 LF-MCG/0.5 IM SUSP: 0.5000 mL | Freq: Once | INTRAMUSCULAR | Status: DC

## 2013-10-04 MED ORDER — METHYLPREDNISOLONE ACETATE 80 MG/ML IJ SUSP
80.0000 mg | Freq: Once | INTRAMUSCULAR | Status: AC
  Administered 2013-10-04: 80 mg via INTRAMUSCULAR

## 2013-10-04 NOTE — Progress Notes (Signed)
   Subjective:    Patient ID: Gabriela Green, female    DOB: 1951/05/20, 63 y.o.   MRN: [id number]  HPI  63 year old female for health maintenance and evaluation of medical issues. Still having tinnitus after flu like illness with 2 rounds of antibiotics. Also recently had floater apperar in right eye.   Past medical history: History of osteopenia. 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 History of allergic rhinitis. Had breast implants 1998. Hysterectomy BSO August 2005. Herniated disc L5-S1 on the left with microdiscectomy done February 1996.  Nonsmoker. Social alcohol consumption.  Colonoscopy by Dr. Sharlett Iles 2009.  Social history: Divorced with 2 adult daughters. Has her own interior design business.  Family history: Father died with pneumonia. 2 brothers and 2 sisters. One sister with diabetes.  Was seen on 2 occasions once in late December and early January for acute respiratory infection. Initially treated with Zithromax Z-PAK and later trial Levaquin.    Review of Systems  Constitutional: Negative.   HENT: Negative.   Eyes:       Floater right eye noticed recently by patient  Respiratory: Negative.   Cardiovascular: Negative.   Gastrointestinal: Negative.   Genitourinary: Negative.   Neurological:       Tinnitus  Hematological: Negative.   Psychiatric/Behavioral: Negative.        Objective:   Physical Exam  Vitals reviewed. Constitutional: She is oriented to person, place, and time. She appears well-developed and well-nourished. No distress.  HENT:  Head: Normocephalic and atraumatic.  Mouth/Throat: No oropharyngeal exudate.  Bilateral serous otitis media with full TMs  Eyes: Conjunctivae are normal. Right eye exhibits no discharge. Left eye exhibits no discharge. No scleral icterus.  Neck: Neck supple. No JVD present. No thyromegaly present.  Cardiovascular: Normal rate, regular rhythm, normal heart sounds and intact distal pulses.   No murmur  heard. Pulmonary/Chest: Effort normal and breath sounds normal. No respiratory distress. She has no wheezes. She has no rales. She exhibits no tenderness.  Breasts normal female w/ implants  Abdominal: Soft. Bowel sounds are normal. She exhibits no distension and no mass. There is no tenderness. There is no rebound and no guarding.  Genitourinary:  Deferred to GYN  Musculoskeletal: Normal range of motion. She exhibits no edema.  Lymphadenopathy:    She has no cervical adenopathy.  Neurological: She is alert and oriented to person, place, and time. She has normal reflexes. She displays normal reflexes. No cranial nerve deficit. Coordination normal.  Skin: Skin is warm and dry. No rash noted. She is not diaphoretic.  Psychiatric: She has a normal mood and affect. Her behavior is normal. Judgment and thought content normal.          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 Consider ENT referral if tinnitus does not improve.

## 2013-11-04 ENCOUNTER — Other Ambulatory Visit: Payer: Self-pay

## 2013-12-22 ENCOUNTER — Other Ambulatory Visit: Payer: Self-pay

## 2013-12-22 MED ORDER — HYOSCYAMINE SULFATE 0.125 MG SL SUBL: 0.1250 mg | SUBLINGUAL_TABLET | SUBLINGUAL | Status: DC | PRN

## 2013-12-22 NOTE — Telephone Encounter (Signed)
Patient calls today requesting a refill of a pill she took before meals to ward off diarrhea. According to paper chart, Dr. Renold Genta rx Levsin at one time. Verbal ok to send this Rx to [facility].. Patient informed.

## 2014-02-10 ENCOUNTER — Encounter: Payer: Self-pay | Admitting: Internal Medicine

## 2014-02-10 ENCOUNTER — Ambulatory Visit (INDEPENDENT_AMBULATORY_CARE_PROVIDER_SITE_OTHER): Payer: BC Managed Care – PPO | Admitting: Internal Medicine

## 2014-02-10 VITALS — BP 110/72 | HR 72 | Temp 98.4°F | Wt 140.0 lb

## 2014-02-10 DIAGNOSIS — N342 Other urethritis: Secondary | ICD-10-CM

## 2014-02-10 DIAGNOSIS — R102 Pelvic and perineal pain: Secondary | ICD-10-CM

## 2014-02-10 DIAGNOSIS — N9489 Other specified conditions associated with female genital organs and menstrual cycle: Secondary | ICD-10-CM

## 2014-02-10 LAB — POCT URINALYSIS DIPSTICK
Bilirubin, UA: NEGATIVE
Blood, UA: NEGATIVE
Glucose, UA: NEGATIVE
KETONES UA: NEGATIVE
LEUKOCYTES UA: NEGATIVE
NITRITE UA: NEGATIVE
PH UA: 7.5
PROTEIN UA: NEGATIVE
Spec Grav, UA: 1.01
UROBILINOGEN UA: NEGATIVE

## 2014-02-11 ENCOUNTER — Encounter: Payer: Self-pay | Admitting: Internal Medicine

## 2014-02-11 MED ORDER — FLUCONAZOLE 150 MG PO TABS: 150.0000 mg | ORAL_TABLET | Freq: Once | ORAL | Status: DC

## 2014-02-11 MED ORDER — CIPROFLOXACIN HCL 500 MG PO TABS: 500.0000 mg | ORAL_TABLET | Freq: Two times a day (BID) | ORAL | Status: DC

## 2014-02-11 NOTE — Patient Instructions (Signed)
Take Cipro twice daily for 5 days. Take Diflucan once and you have one refill on Diflucan prescription should you need it for yeast vaginitis symptoms

## 2014-02-11 NOTE — Progress Notes (Signed)
   Subjective:    Patient ID: Gabriela Green, female    DOB: April 13, 1951, 63 y.o.   MRN: [id number]  HPI Patient has felt some pressure in suprapubic area and some irritation around urethra. Has recently been sexually active. Thought she might have a yeast infection and used over-the-counter Monistat but it caused a lot of discomfort and burning. She then she tried some Azo-Standard but that did not help either.    Review of Systems     Objective:   Physical Exam  See urine results. Urinalysis is completely normal. No CVA tenderness      Assessment & Plan:    Urethritis  Plan: Cipro 500 mg twice daily for 5 days. Diflucan 150 mg tablet one time dose with one refill.

## 2014-02-22 ENCOUNTER — Other Ambulatory Visit: Payer: Self-pay

## 2014-02-22 DIAGNOSIS — Z1231 Encounter for screening mammogram for malignant neoplasm of breast: Secondary | ICD-10-CM

## 2014-03-29 NOTE — Patient Instructions (Addendum)
Consider ENT referral if the tinnitus does not resolve. Otherwise return in one year. Have bone density study either through GYN or at the [facility]

## 2014-03-30 ENCOUNTER — Ambulatory Visit
Admission: RE | Admit: 2014-03-30 | Discharge: 2014-03-30 | Disposition: A | Discharge status: Home / Self Care | Payer: BC Managed Care – PPO | Source: Ambulatory Visit

## 2014-03-30 DIAGNOSIS — Z1231 Encounter for screening mammogram for malignant neoplasm of breast: Secondary | ICD-10-CM

## 2014-06-13 ENCOUNTER — Telehealth: Payer: Self-pay

## 2014-06-13 MED ORDER — HYOSCYAMINE SULFATE 0.125 MG SL SUBL: 0.1250 mg | SUBLINGUAL_TABLET | SUBLINGUAL | Status: DC | PRN

## 2014-06-13 NOTE — Telephone Encounter (Addendum)
Patient is having trouble with diarrhea.  She gets this not long after she eats.  She is going out of town and is concerned about it.  She was given something in the past that she took 30 min or so before she ate to help prevent this issue.  She was wondering if this is something she can get again?  Patient uses [facility].  Please advise.  Patient Cell [phone]. Patient has Levsin on file at drugstore 0.125 mg sublingual every 4 hours when necessary. Will make sure prescription is up-to-date.

## 2014-06-13 NOTE — Telephone Encounter (Signed)
Left message advising patient that an rx for levsin was sent to pharmacy.

## 2014-07-10 ENCOUNTER — Encounter: Payer: Self-pay | Admitting: Internal Medicine

## 2014-07-12 ENCOUNTER — Ambulatory Visit (INDEPENDENT_AMBULATORY_CARE_PROVIDER_SITE_OTHER): Payer: BC Managed Care – PPO | Admitting: Internal Medicine

## 2014-07-12 DIAGNOSIS — Z23 Encounter for immunization: Secondary | ICD-10-CM

## 2014-08-05 NOTE — Patient Instructions (Signed)
Flu vaccine given.

## 2014-08-05 NOTE — Progress Notes (Signed)
   Subjective:    Patient ID: Gabriela Green, female    DOB: 01-Aug-1951, 63 y.o.   MRN: [id number]  HPI    Review of Systems     Objective:   Physical Exam  Flu vaccine given      Assessment & Plan:

## 2014-09-07 ENCOUNTER — Ambulatory Visit (INDEPENDENT_AMBULATORY_CARE_PROVIDER_SITE_OTHER): Payer: BC Managed Care – PPO | Admitting: Internal Medicine

## 2014-09-07 ENCOUNTER — Encounter: Payer: Self-pay | Admitting: Internal Medicine

## 2014-09-07 VITALS — BP 116/70 | HR 78 | Temp 97.8°F | Wt 144.0 lb

## 2014-09-07 DIAGNOSIS — J069 Acute upper respiratory infection, unspecified: Secondary | ICD-10-CM

## 2014-09-07 MED ORDER — HYDROCODONE-HOMATROPINE 5-1.5 MG/5ML PO SYRP: 5.0000 mL | ORAL_SOLUTION | Freq: Three times a day (TID) | ORAL | Status: DC | PRN

## 2014-09-07 MED ORDER — LEVOFLOXACIN 500 MG PO TABS: 500.0000 mg | ORAL_TABLET | Freq: Every day | ORAL | Status: DC

## 2014-09-07 NOTE — Progress Notes (Signed)
   Subjective:    Patient ID: Gabriela Green, female    DOB: 11-Aug-1951, 63 y.o.   MRN: [id number]  HPI 3 day history of URI symptoms. No fever or shaking chills. Has had cough. His been taking Mucinex at night. Cough is really not productive. Thinks cough is getting a bit deeper and worse. No discolored nasal drainage.    Review of Systems     Objective:   Physical Exam  Pharynx very slightly injected without exudate. TMs are clear. Neck is supple without adenopathy. Chest clear to auscultation.      Assessment & Plan:  Acute respiratory infection  Plan: Levaquin 500 milligrams daily for 7 days with one refill. Hycodan 1 teaspoon by mouth every 6-8 hours when necessary cough.

## 2014-09-07 NOTE — Patient Instructions (Signed)
Take Levaquin 500 milligrams daily for 7 days. If not better in 7 days have prescription refilled. Hycodan as needed for cough.

## 2014-10-02 ENCOUNTER — Other Ambulatory Visit: Payer: Self-pay | Admitting: Internal Medicine

## 2014-10-03 ENCOUNTER — Other Ambulatory Visit: Payer: Self-pay | Admitting: Internal Medicine

## 2014-10-03 ENCOUNTER — Other Ambulatory Visit: Payer: BC Managed Care – PPO | Admitting: Internal Medicine

## 2014-10-05 ENCOUNTER — Other Ambulatory Visit: Payer: BLUE CROSS/BLUE SHIELD | Admitting: Internal Medicine

## 2014-10-05 ENCOUNTER — Ambulatory Visit (INDEPENDENT_AMBULATORY_CARE_PROVIDER_SITE_OTHER): Payer: BLUE CROSS/BLUE SHIELD | Admitting: Internal Medicine

## 2014-10-05 ENCOUNTER — Encounter: Payer: Self-pay | Admitting: Internal Medicine

## 2014-10-05 VITALS — BP 106/72 | HR 74 | Temp 98.0°F | Wt 144.0 lb

## 2014-10-05 DIAGNOSIS — Z0000 Encounter for general adult medical examination without abnormal findings: Secondary | ICD-10-CM

## 2014-10-05 DIAGNOSIS — L03116 Cellulitis of left lower limb: Secondary | ICD-10-CM

## 2014-10-05 DIAGNOSIS — Z13220 Encounter for screening for lipoid disorders: Secondary | ICD-10-CM

## 2014-10-05 DIAGNOSIS — Z1329 Encounter for screening for other suspected endocrine disorder: Secondary | ICD-10-CM

## 2014-10-05 DIAGNOSIS — K589 Irritable bowel syndrome without diarrhea: Secondary | ICD-10-CM

## 2014-10-05 DIAGNOSIS — Z1321 Encounter for screening for nutritional disorder: Secondary | ICD-10-CM

## 2014-10-05 DIAGNOSIS — Z130 Encounter for screening for diseases of the blood and blood-forming organs and certain disorders involving the immune mechanism: Secondary | ICD-10-CM

## 2014-10-05 LAB — COMPREHENSIVE METABOLIC PANEL
ALT: 11 U/L (ref 0–35)
AST: 12 U/L (ref 0–37)
Albumin: 3.8 g/dL (ref 3.5–5.2)
Alkaline Phosphatase: 35 U/L — ABNORMAL LOW (ref 39–117)
BILIRUBIN TOTAL: 0.6 mg/dL (ref 0.2–1.2)
BUN: 12 mg/dL (ref 6–23)
CALCIUM: 8.7 mg/dL (ref 8.4–10.5)
CHLORIDE: 103 meq/L (ref 96–112)
CO2: 27 mEq/L (ref 19–32)
Creat: 0.53 mg/dL (ref 0.50–1.10)
GLUCOSE: 94 mg/dL (ref 70–99)
Potassium: 4.4 mEq/L (ref 3.5–5.3)
Sodium: 137 mEq/L (ref 135–145)
Total Protein: 6.4 g/dL (ref 6.0–8.3)

## 2014-10-05 LAB — CBC WITH DIFFERENTIAL/PLATELET
BASOS PCT: 1 % (ref 0–1)
Basophils Absolute: 0 10*3/uL (ref 0.0–0.1)
Eosinophils Absolute: 0.1 10*3/uL (ref 0.0–0.7)
Eosinophils Relative: 3 % (ref 0–5)
HEMATOCRIT: 39.1 % (ref 36.0–46.0)
Hemoglobin: 13.1 g/dL (ref 12.0–15.0)
Lymphocytes Relative: 27 % (ref 12–46)
Lymphs Abs: 1.3 10*3/uL (ref 0.7–4.0)
MCH: 31.6 pg (ref 26.0–34.0)
MCHC: 33.5 g/dL (ref 30.0–36.0)
MCV: 94.2 fL (ref 78.0–100.0)
MONO ABS: 0.6 10*3/uL (ref 0.1–1.0)
MPV: 9.4 fL (ref 8.6–12.4)
Monocytes Relative: 13 % — ABNORMAL HIGH (ref 3–12)
NEUTROS ABS: 2.7 10*3/uL (ref 1.7–7.7)
Neutrophils Relative %: 56 % (ref 43–77)
PLATELETS: 293 10*3/uL (ref 150–400)
RBC: 4.15 MIL/uL (ref 3.87–5.11)
RDW: 12.7 % (ref 11.5–15.5)
WBC: 4.8 10*3/uL (ref 4.0–10.5)

## 2014-10-05 LAB — LIPID PANEL
CHOL/HDL RATIO: 2.6 ratio
Cholesterol: 168 mg/dL (ref 0–200)
HDL: 65 mg/dL (ref >39)
LDL Cholesterol: 83 mg/dL (ref 0–99)
Triglycerides: 102 mg/dL (ref <150)
VLDL: 20 mg/dL (ref 0–40)

## 2014-10-05 LAB — TSH: TSH: 1.938 u[IU]/mL (ref 0.350–4.500)

## 2014-10-05 MED ORDER — HYOSCYAMINE SULFATE 0.125 MG SL SUBL: SUBLINGUAL_TABLET | SUBLINGUAL | Status: DC

## 2014-10-05 NOTE — Patient Instructions (Addendum)
New prescription for Levsin 0.125 mg sublingual before meals and at bedtime as needed for irritable bowel syndrome. Fasting labs pending. Take antibiotic prescribed by dermatologist for cellulitis leg

## 2014-10-06 LAB — VITAMIN D 25 HYDROXY (VIT D DEFICIENCY, FRACTURES): Vit D, 25-Hydroxy: 24 ng/mL — ABNORMAL LOW (ref 30–100)

## 2014-10-08 DIAGNOSIS — K589 Irritable bowel syndrome without diarrhea: Secondary | ICD-10-CM | POA: Insufficient documentation

## 2014-10-08 NOTE — Progress Notes (Signed)
Subjective:    Patient ID: Gabriela Green, female    DOB: 07-Oct-1950, 64 y.o.   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 Recently had sclera therapy left lower extremity for spider vein. Has developed some erythema and injection site. Dermatologist gave first of antibiotics but she is not taking these at the present time.  Past medical history: History of osteopenia, history of herpes simplex type II with rare outbreaks. History of allergic rhinitis. Had breast implants 1998. Hysterectomy, BSO August 2005. Herniated disc L5-S1 on the left with microdiscectomy done February 1996  Colonoscopy by Dr. Sharlett Iles in 2009.  Social history: She is divorced. Nonsmoker. Social alcohol consumption. 2 daughters. Self-employed as an Futures trader.  Family history: Father died with pneumonia. 2 brothers and 2 sisters. One sister with diabetes.  Has had some issues with what sounds like irritable bowel syndrome with attacks of diarrhea from time. Have prescribed Levsin in the past. However long trips with client she is concerned she could have acute episode of diarrhea would like Levsin refilled. Explained she could take it up to 4 times a day. Have given her handout from Dr. Sharlett Iles on irritable bowel syndrome. Talked with her about foods that can trigger irritable bowel symptoms.    Review of Systems  Constitutional: Negative.   HENT: Negative.   Respiratory: Negative.   Cardiovascular: Negative.   Gastrointestinal:       Frequent bouts of diarrhea. Talked about lactose intolerance.  Genitourinary: Negative.   Neurological: Negative.   Psychiatric/Behavioral: Negative.        Objective:   Physical Exam  Constitutional: She appears well-developed and well-nourished. No distress.  HENT:  Head: Normocephalic and atraumatic.  Right Ear: External ear normal.  Left Ear: External ear normal.    Mouth/Throat: No oropharyngeal exudate.  Eyes: Conjunctivae and EOM are normal. Pupils are equal, round, and reactive to light. No scleral icterus.  Neck: Neck supple. No JVD present. No thyromegaly present.  Cardiovascular: Normal rate, regular rhythm, normal heart sounds and intact distal pulses.   No murmur heard. Pulmonary/Chest: Effort normal and breath sounds normal. No respiratory distress. She has no wheezes. She has no rales. She exhibits no tenderness.  Bilateral breast implants. No masses.  Abdominal: Soft. Bowel sounds are normal. She exhibits no distension and no mass. There is no tenderness. There is no rebound and no guarding.  Genitourinary:  Deferred to GYN  Musculoskeletal: Normal range of motion. She exhibits no edema.  Lymphadenopathy:    She has no cervical adenopathy.  Neurological: She is alert. She has normal reflexes. No cranial nerve deficit. Coordination normal.  Skin: Skin is warm and dry. No rash noted. She is not diaphoretic.  Has erythematous area at point of injection for sclerotherapy for spider veins left lower anterior leg  Psychiatric: She has a normal mood and affect. Her behavior is normal. Judgment and thought content normal.  Vitals reviewed.         Assessment & Plan:  Possible cellulitis left anterior lower leg secondary to sclerotherapy. Dermatologist is given her antibiotics and I think she should take those. 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 Handout on irritable bowel syndrome. Discussion about foods and stress that could  cause this. Return in one year or as needed.

## 2014-12-14 ENCOUNTER — Telehealth: Payer: Self-pay | Admitting: Internal Medicine

## 2014-12-14 NOTE — Telephone Encounter (Signed)
Patient calls stating she has a sore area on her upper left thigh x 2-3 months.   Has not injured the area that she knows of.  Doesn't PAIN her, but is sore if she touches it or accidentally hits it or brushes against it.  There are no lumps or discolorations or bruising in/on the skin.  No raised area or rash on the skin.  She's just curious if she should be seen; if perhaps it could be indicative of something that perhaps she's not thinking of.  Should I bring her in for OV?

## 2014-12-14 NOTE — Telephone Encounter (Signed)
See tomorrow

## 2014-12-15 ENCOUNTER — Encounter: Payer: Self-pay | Admitting: Internal Medicine

## 2014-12-15 ENCOUNTER — Ambulatory Visit (INDEPENDENT_AMBULATORY_CARE_PROVIDER_SITE_OTHER): Payer: BLUE CROSS/BLUE SHIELD | Admitting: Internal Medicine

## 2014-12-15 VITALS — BP 116/68 | HR 74 | Temp 97.8°F | Wt 145.0 lb

## 2014-12-15 DIAGNOSIS — G5712 Meralgia paresthetica, left lower limb: Secondary | ICD-10-CM | POA: Diagnosis not present

## 2014-12-15 DIAGNOSIS — M79605 Pain in left leg: Secondary | ICD-10-CM

## 2014-12-15 MED ORDER — MELOXICAM 15 MG PO TABS: 15.0000 mg | ORAL_TABLET | Freq: Every day | ORAL | Status: DC

## 2014-12-15 NOTE — Patient Instructions (Signed)
Take Mobic 15 mg daily for 2-3 weeks. Decrease exercise for the next 2 weeks. Do not wear tight clothing.

## 2014-12-15 NOTE — Progress Notes (Signed)
   Subjective:    Patient ID: Gabriela Green, female    DOB: May 04, 1951, 64 y.o.   MRN: [id number]  HPI  For approximate 3 months patient has noted odd sensation left lateral thigh. Says that it is something like a skin discomfort/pain. She does exercise regularly. Hasn't been doing any heavy exercise with her legs. No pain in buttock. No fall or known injury. Says that the area is oval in shape over left lateral thigh that has abnormal feeling.    Review of Systems     Objective:   Physical Exam  Patient is able to trace oval area about 3 x 5 cm left lateral thigh that has abnormal sensation. There is no palpable abnormality. Straight leg raising is negative at 90. Muscle strength is normal in the left lower extremity. Normal reflexes.      Assessment & Plan:  Suspect meralgia paresthetica  Plan: Mobic 15 mg daily for 2-3 weeks. Decrease exercise for the next 2 weeks. Have x-ray of left thigh. Information given on meralgia paresthetica.

## 2014-12-15 NOTE — Telephone Encounter (Signed)
Spoke with patient this a.m. And gave appointment for today, 4/8 @ 2pm.

## 2015-02-26 ENCOUNTER — Other Ambulatory Visit: Payer: Self-pay

## 2015-02-26 DIAGNOSIS — Z1231 Encounter for screening mammogram for malignant neoplasm of breast: Secondary | ICD-10-CM

## 2015-05-24 ENCOUNTER — Ambulatory Visit
Admission: RE | Admit: 2015-05-24 | Discharge: 2015-05-24 | Disposition: A | Discharge status: Home / Self Care | Payer: BLUE CROSS/BLUE SHIELD | Source: Ambulatory Visit

## 2015-05-24 DIAGNOSIS — Z1231 Encounter for screening mammogram for malignant neoplasm of breast: Secondary | ICD-10-CM

## 2015-05-25 ENCOUNTER — Encounter: Payer: Self-pay | Admitting: Gastroenterology

## 2015-05-29 ENCOUNTER — Other Ambulatory Visit: Payer: Self-pay | Admitting: Internal Medicine

## 2015-05-29 DIAGNOSIS — R928 Other abnormal and inconclusive findings on diagnostic imaging of breast: Secondary | ICD-10-CM

## 2015-06-01 ENCOUNTER — Ambulatory Visit
Admission: RE | Admit: 2015-06-01 | Discharge: 2015-06-01 | Disposition: A | Discharge status: Home / Self Care | Payer: BLUE CROSS/BLUE SHIELD | Source: Ambulatory Visit | Attending: Internal Medicine | Admitting: Internal Medicine

## 2015-06-01 DIAGNOSIS — R928 Other abnormal and inconclusive findings on diagnostic imaging of breast: Secondary | ICD-10-CM

## 2015-07-10 ENCOUNTER — Encounter: Payer: Self-pay | Admitting: Internal Medicine

## 2015-07-10 ENCOUNTER — Ambulatory Visit (INDEPENDENT_AMBULATORY_CARE_PROVIDER_SITE_OTHER): Payer: BLUE CROSS/BLUE SHIELD | Admitting: Internal Medicine

## 2015-07-10 VITALS — BP 114/68 | HR 72

## 2015-07-10 DIAGNOSIS — Z23 Encounter for immunization: Secondary | ICD-10-CM | POA: Diagnosis not present

## 2015-07-30 ENCOUNTER — Ambulatory Visit (INDEPENDENT_AMBULATORY_CARE_PROVIDER_SITE_OTHER): Payer: BLUE CROSS/BLUE SHIELD | Admitting: Internal Medicine

## 2015-07-30 ENCOUNTER — Encounter: Payer: Self-pay | Admitting: Internal Medicine

## 2015-07-30 VITALS — BP 110/70 | HR 80 | Temp 97.8°F | Resp 20 | Wt 143.0 lb

## 2015-07-30 DIAGNOSIS — B009 Herpesviral infection, unspecified: Secondary | ICD-10-CM

## 2015-07-30 DIAGNOSIS — J069 Acute upper respiratory infection, unspecified: Secondary | ICD-10-CM | POA: Diagnosis not present

## 2015-07-30 MED ORDER — AZITHROMYCIN 250 MG PO TABS
ORAL_TABLET | ORAL | Status: DC
Start: 2015-07-30 — End: 2015-10-18

## 2015-07-30 MED ORDER — VALACYCLOVIR HCL 500 MG PO TABS: 500.0000 mg | ORAL_TABLET | Freq: Two times a day (BID) | ORAL | Status: DC

## 2015-07-30 NOTE — Patient Instructions (Addendum)
Takes Zithromax Z-PAK as directed for respiratory infection. Take Hycodan or Tessalon Perles for cough. Valtrex refilled for 1 year to take when necessary outbreaks.

## 2015-07-30 NOTE — Progress Notes (Signed)
   Subjective:    Patient ID: Fredderick Phenix, female    DOB: 1951/01/06, 64 y.o.   MRN: [id number]  HPI  Sore throat and hoarseness and congestion for couple of weeks. No fever or shaking chills. Has new grandchild and she is concerned about getting him sick. Having lots of people over for Thanksgiving. Some cough with slight sputum production. No runny nose.  Also recently felt she was getting an outbreak of herpes simplex type II. She never did break out but found some leftover Zovirax which she took for a few days. Brings in multiple medications from home that she would like to have sorted out including Keflex and Cipro, Hycodan and Tessalon Perles.    Review of Systems as above     Objective:   Physical Exam  Skin warm and dry. She is hoarse when she speaks. Pharynx injected. TMs are clear. Neck is supple without adenopathy. Chest clear to auscultation.      Assessment & Plan:  Acute URI  Plan: Zithromax Z-Pak take 2 tablets day one followed by 1 tablet days 2 through 5. Valtrex 500 mg twice daily for 5 days if develops recurrent herpes simplex type II. May take Hycodan her Tessalon Perles for cough.

## 2015-09-18 ENCOUNTER — Telehealth: Payer: Self-pay

## 2015-09-18 ENCOUNTER — Other Ambulatory Visit: Payer: Self-pay | Admitting: Internal Medicine

## 2015-09-18 DIAGNOSIS — N644 Mastodynia: Secondary | ICD-10-CM

## 2015-09-18 NOTE — Telephone Encounter (Signed)
Patient needs to set up appt with them and they are happy to discuss with the patient what her symptoms are. They are asking for a right breast ultrasound and diagnostic mammogram right breast if needed.

## 2015-09-18 NOTE — Telephone Encounter (Signed)
Patient called stating that she would like a referral to breast center for stinging and burning in right breast. She denies pain and denies finding a knot. She states that she had her mammogram in September and had a repeat right breast mammogram due in inconclusive results. Please advise if we can order a new mammogram for her as it was recommended repeat in 1 year, but the stinging and burning pain in right breast x 3 weeks.

## 2015-09-18 NOTE — Telephone Encounter (Signed)
Patient states that if she needs to be seen in office her she will be back in town next week.

## 2015-09-18 NOTE — Telephone Encounter (Signed)
Please call [facility] and see if patient can see Radiologist to discuss her issues.

## 2015-09-25 ENCOUNTER — Other Ambulatory Visit: Payer: Self-pay | Admitting: Internal Medicine

## 2015-09-25 ENCOUNTER — Ambulatory Visit
Admission: RE | Admit: 2015-09-25 | Discharge: 2015-09-25 | Disposition: A | Discharge status: Home / Self Care | Payer: BLUE CROSS/BLUE SHIELD | Source: Ambulatory Visit | Attending: Internal Medicine | Admitting: Internal Medicine

## 2015-09-25 DIAGNOSIS — N644 Mastodynia: Secondary | ICD-10-CM

## 2015-10-09 ENCOUNTER — Other Ambulatory Visit: Payer: BLUE CROSS/BLUE SHIELD | Admitting: Internal Medicine

## 2015-10-11 ENCOUNTER — Encounter: Payer: BLUE CROSS/BLUE SHIELD | Admitting: Internal Medicine

## 2015-10-16 ENCOUNTER — Other Ambulatory Visit: Payer: BLUE CROSS/BLUE SHIELD | Admitting: Internal Medicine

## 2015-10-16 DIAGNOSIS — Z1329 Encounter for screening for other suspected endocrine disorder: Secondary | ICD-10-CM

## 2015-10-16 DIAGNOSIS — Z1321 Encounter for screening for nutritional disorder: Secondary | ICD-10-CM

## 2015-10-16 DIAGNOSIS — Z130 Encounter for screening for diseases of the blood and blood-forming organs and certain disorders involving the immune mechanism: Secondary | ICD-10-CM

## 2015-10-16 DIAGNOSIS — Z13220 Encounter for screening for lipoid disorders: Secondary | ICD-10-CM

## 2015-10-16 DIAGNOSIS — Z0000 Encounter for general adult medical examination without abnormal findings: Secondary | ICD-10-CM

## 2015-10-16 LAB — CBC WITH DIFFERENTIAL/PLATELET
Basophils Absolute: 0 10*3/uL (ref 0.0–0.1)
Basophils Relative: 1 % (ref 0–1)
EOS ABS: 0.1 10*3/uL (ref 0.0–0.7)
Eosinophils Relative: 2 % (ref 0–5)
HCT: 39.4 % (ref 36.0–46.0)
Hemoglobin: 13.5 g/dL (ref 12.0–15.0)
LYMPHS ABS: 1.1 10*3/uL (ref 0.7–4.0)
Lymphocytes Relative: 23 % (ref 12–46)
MCH: 32.1 pg (ref 26.0–34.0)
MCHC: 34.3 g/dL (ref 30.0–36.0)
MCV: 93.6 fL (ref 78.0–100.0)
MONO ABS: 0.5 10*3/uL (ref 0.1–1.0)
MONOS PCT: 11 % (ref 3–12)
MPV: 9.3 fL (ref 8.6–12.4)
NEUTROS PCT: 63 % (ref 43–77)
Neutro Abs: 3 10*3/uL (ref 1.7–7.7)
PLATELETS: 311 10*3/uL (ref 150–400)
RBC: 4.21 MIL/uL (ref 3.87–5.11)
RDW: 13 % (ref 11.5–15.5)
WBC: 4.7 10*3/uL (ref 4.0–10.5)

## 2015-10-16 LAB — COMPLETE METABOLIC PANEL WITH GFR
ALBUMIN: 3.9 g/dL (ref 3.6–5.1)
ALK PHOS: 32 U/L — AB (ref 33–130)
ALT: 12 U/L (ref 6–29)
AST: 12 U/L (ref 10–35)
BILIRUBIN TOTAL: 0.4 mg/dL (ref 0.2–1.2)
BUN: 14 mg/dL (ref 7–25)
CO2: 26 mmol/L (ref 20–31)
Calcium: 9.1 mg/dL (ref 8.6–10.4)
Chloride: 104 mmol/L (ref 98–110)
Creat: 0.6 mg/dL (ref 0.50–0.99)
Glucose, Bld: 88 mg/dL (ref 65–99)
Potassium: 4.7 mmol/L (ref 3.5–5.3)
Sodium: 140 mmol/L (ref 135–146)
TOTAL PROTEIN: 6.5 g/dL (ref 6.1–8.1)

## 2015-10-16 LAB — LIPID PANEL
Cholesterol: 177 mg/dL (ref 125–200)
HDL: 74 mg/dL (ref >=46)
LDL Cholesterol: 86 mg/dL (ref <130)
TRIGLYCERIDES: 83 mg/dL (ref <150)
Total CHOL/HDL Ratio: 2.4 Ratio (ref <=5.0)
VLDL: 17 mg/dL (ref <30)

## 2015-10-16 LAB — TSH: TSH: 1.62 mIU/L

## 2015-10-17 LAB — VITAMIN D 25 HYDROXY (VIT D DEFICIENCY, FRACTURES): Vit D, 25-Hydroxy: 30 ng/mL (ref 30–100)

## 2015-10-18 ENCOUNTER — Encounter: Payer: Self-pay | Admitting: Internal Medicine

## 2015-10-18 ENCOUNTER — Ambulatory Visit (INDEPENDENT_AMBULATORY_CARE_PROVIDER_SITE_OTHER): Payer: BLUE CROSS/BLUE SHIELD | Admitting: Internal Medicine

## 2015-10-18 VITALS — BP 108/66 | HR 78 | Temp 97.9°F | Resp 20 | Ht 67.0 in | Wt 142.0 lb

## 2015-10-18 DIAGNOSIS — Z8669 Personal history of other diseases of the nervous system and sense organs: Secondary | ICD-10-CM | POA: Diagnosis not present

## 2015-10-18 DIAGNOSIS — Z0000 Encounter for general adult medical examination without abnormal findings: Secondary | ICD-10-CM | POA: Diagnosis not present

## 2015-10-18 DIAGNOSIS — K580 Irritable bowel syndrome with diarrhea: Secondary | ICD-10-CM | POA: Diagnosis not present

## 2015-10-18 DIAGNOSIS — E559 Vitamin D deficiency, unspecified: Secondary | ICD-10-CM | POA: Diagnosis not present

## 2015-10-18 LAB — POCT URINALYSIS DIPSTICK
BILIRUBIN UA: NEGATIVE
Blood, UA: NEGATIVE
GLUCOSE UA: NEGATIVE
KETONES UA: NEGATIVE
LEUKOCYTES UA: NEGATIVE
NITRITE UA: NEGATIVE
PH UA: 6.5
Protein, UA: NEGATIVE
Spec Grav, UA: 1.015
Urobilinogen, UA: 0.2

## 2015-11-06 NOTE — Patient Instructions (Addendum)
It was a pleasure to see you today. Return in one year or as needed. Have annual mammogram. Have bone density study every 2 years. Take 2000 units vitamin D 3 daily. Levsin as needed for irritable bowel symptoms.

## 2015-11-06 NOTE — Progress Notes (Signed)
   Subjective:    Patient ID: Gabriela Green, female    DOB: August 02, 1951, 65 y.o.   MRN: [id number]  HPI Pleasant 65 year old White Female in today for health maintenance exam and evaluation of medical issues.  General health is good. She has a history of osteopenia. 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 History of allergic rhinitis. Had breast implants 1998. Hysterectomy BSO August 2005. Herniated disc L5-S1 on the left with microdiscectomy done February 1996. History of tinnitus which is felt to be benign.  Nonsmoker. Social alcohol consumption.  Colonoscopy by Dr. Sharlett Iles 2009  Social history: Divorced with 2 adult daughters. She has 3 grandchildren. 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  Family history: Father died with pneumonia. 2 brothers and 2 sisters. One sister with diabetes.  Had flu vaccine Fall of 2016       Review of Systems as above-noncontributory     Objective:   Physical Exam  Constitutional: She appears well-developed and well-nourished. No distress.  HENT:  Head: Normocephalic and atraumatic.  Right Ear: External ear normal.  Left Ear: External ear normal.  Mouth/Throat: Oropharynx is clear and moist. No oropharyngeal exudate.  Eyes: Conjunctivae and EOM are normal. Pupils are equal, round, and reactive to light. Right eye exhibits no discharge. Left eye exhibits no discharge. No scleral icterus.  Neck: Neck supple. No JVD present. No thyromegaly present.  Cardiovascular: Normal rate, regular rhythm, normal heart sounds and intact distal pulses.   No murmur heard. Pulmonary/Chest: Effort normal and breath sounds normal. No respiratory distress. She has no wheezes. She has no rales.  Breasts bilateral implants without masses  Abdominal: Soft. Bowel sounds are normal. She exhibits no distension and no mass. There is no tenderness. There is no rebound and no guarding.  Genitourinary:  Deferred due to TAH BSO  Musculoskeletal: She  exhibits no edema.  Lymphadenopathy:    She has no cervical adenopathy.  Skin: Skin is warm and dry. No rash noted. She is not diaphoretic.  Psychiatric: She has a normal mood and affect. Her behavior is normal. Judgment and thought content normal.  Vitals reviewed.         Assessment & Plan:  Normal health maintenance exam  History of tinnitus thought to be benign  Irritable bowel syndrome for which she takes when necessary Levsin  History of osteopenia  History of HSV type II with rare outbreaks treated with Valtrex  Plan: Continue annual mammogram. Recommend bone density study every 2 years. Annual flu vaccine suggested. Return in one year or as noted.

## 2016-01-21 ENCOUNTER — Emergency Department (HOSPITAL_COMMUNITY)
Admission: EM | Admit: 2016-01-21 | Discharge: 2016-01-21 | Disposition: A | Discharge status: Home / Self Care | Payer: BLUE CROSS/BLUE SHIELD | Attending: Emergency Medicine | Admitting: Emergency Medicine

## 2016-01-21 ENCOUNTER — Encounter (HOSPITAL_COMMUNITY): Payer: Self-pay

## 2016-01-21 DIAGNOSIS — Z8619 Personal history of other infectious and parasitic diseases: Secondary | ICD-10-CM | POA: Insufficient documentation

## 2016-01-21 DIAGNOSIS — M25511 Pain in right shoulder: Secondary | ICD-10-CM | POA: Diagnosis present

## 2016-01-21 DIAGNOSIS — Z79899 Other long term (current) drug therapy: Secondary | ICD-10-CM | POA: Insufficient documentation

## 2016-01-21 DIAGNOSIS — Z7982 Long term (current) use of aspirin: Secondary | ICD-10-CM | POA: Diagnosis not present

## 2016-01-21 DIAGNOSIS — Z87448 Personal history of other diseases of urinary system: Secondary | ICD-10-CM | POA: Insufficient documentation

## 2016-01-21 MED ORDER — IBUPROFEN 600 MG PO TABS: 600.0000 mg | ORAL_TABLET | Freq: Four times a day (QID) | ORAL | Status: DC | PRN

## 2016-01-21 NOTE — ED Notes (Signed)
Pt complaining of R shoulder pain. States woken up due to pain. Pt states pain radiates down arm from shoulder.

## 2016-01-21 NOTE — ED Provider Notes (Signed)
CSN: [id number]     Arrival date & time 01/21/16  0209 History  By signing my name below, I, Gabriela Green, attest that this documentation has been prepared under the direction and in the presence of Leo Grosser, MD . Electronically Signed: Rowan Green, Scribe. 01/21/2016. 4:16 AM.   Chief Complaint  Patient presents with  . Shoulder Pain   The history is provided by the patient. No language interpreter was used.   HPI Comments:  Gabriela Green is a 65 y.o. female who presents to the Emergency Department complaining of gradual onset, aching right shoulder pain radiating to right arm. Pt notes she had aching pain in her shoulder prior to falling asleep tonight; the pain worsened and woke her from sleep earlier tonight. Her pain currently feels like diffuse aching in her right arm. Pt took aspirin PTA with mild relief. Pt reports h/o right shoulder pain. Denies recent trauma, neck pain, numbness or weakness.  Past Medical History  Diagnosis Date  . HSV-2 (herpes simplex virus 2) infection   . Interstitial cystitis   . Allergy   . TMJ syndrome    Past Surgical History  Procedure Laterality Date  . Abdominal hysterectomy    . Breast enhancement surgery    . Microdiscectomy lumbar  2/96    L5-S1   Family History  Problem Relation Age of Onset  . Diabetes Sister    Social History  Substance Use Topics  . Smoking status: Never Smoker   . Smokeless tobacco: Never Used  . Alcohol Use: None   OB History    Gravida Para Term Preterm AB TAB SAB Ectopic Multiple Living   4         2     Review of Systems  Musculoskeletal: Positive for arthralgias. Negative for neck pain.  Neurological: Negative for weakness.  All other systems reviewed and are negative.  Allergies  Review of patient's allergies indicates no known allergies.  Home Medications   Prior to Admission medications   Medication Sig Start Date End Date Taking? Authorizing Provider  aspirin EC 81 MG tablet Take  162 mg by mouth daily as needed (chest pain).   Yes Historical Provider, MD  Biotin 1 MG CAPS Take 1 capsule by mouth daily.    Yes Historical Provider, MD  cholecalciferol (VITAMIN D) 1000 UNITS tablet Take 1,000 Units by mouth daily.   Yes Historical Provider, MD  estradiol (ESTRACE) 1 MG tablet Take 1 tablet (1 mg total) by mouth daily. 11/08/12  Yes Earnstine Regal, PA-C  hyoscyamine (LEVSIN/SL) 0.125 MG SL tablet 1 tablet under tongue one half hour before meals and at bedtime as needed for irritable bowel syndrome 10/05/14  Yes Elby Showers, MD  Misc Natural Products (PROGESTERONE) 1000 MG/60GM CREA Apply 1 application topically daily.    Yes Historical Provider, MD  valACYclovir (VALTREX) 500 MG tablet Take 1 tablet (500 mg total) by mouth 2 (two) times daily. Patient taking differently: Take 500 mg by mouth 2 (two) times daily as needed (outbreak).  07/30/15  Yes Elby Showers, MD   BP 106/59 mmHg  Pulse 65  Temp(Src) 98.2 F (36.8 C) (Oral)  Resp 14  SpO2 98%   Physical Exam  Constitutional: She is oriented to person, place, and time. She appears well-developed and well-nourished. No distress.  HENT:  Head: Normocephalic.  Eyes: Conjunctivae are normal.  Neck: Neck supple. No tracheal deviation present.  Cardiovascular: Normal rate and regular rhythm.   Pulmonary/Chest: Effort  normal. No respiratory distress.  Abdominal: Soft. She exhibits no distension.  Musculoskeletal:  Right shoulder: Full passive ROM; no crepitus; no tenderness  Neurological: She is alert and oriented to person, place, and time.  Skin: Skin is warm and dry.  Psychiatric: She has a normal mood and affect.   ED Course  Procedures  DIAGNOSTIC STUDIES:  Oxygen Saturation is 100% on RA, normal by my interpretation.    COORDINATION OF CARE:  4:14 AM Recommended pt ice shoulder and keep joint mobile. Will start on Naproxen. Discussed treatment plan with pt at bedside and pt agreed to plan.  Labs  Review Labs Reviewed - No data to display  Imaging Review No results found. I have personally reviewed and evaluated these images and lab results as part of my medical decision-making.   EKG Interpretation None      MDM   Final diagnoses:  Right shoulder pain    65 y.o. female presents with right shoulder pain. Was playing with grandchildren before onset and noticed worse after sleeping made slightly better by aspirin. Exam and description c/w MSK pain. Patient was recommended to take short course of scheduled NSAIDs and engage in early mobility as definitive treatment.   I personally performed the services described in this documentation, which was scribed in my presence. The recorded information has been reviewed and is accurate.     Leo Grosser, MD 01/21/16 [phone]

## 2016-01-21 NOTE — Discharge Instructions (Signed)

## 2016-02-06 DIAGNOSIS — M720 Palmar fascial fibromatosis [Dupuytren]: Secondary | ICD-10-CM | POA: Insufficient documentation

## 2016-03-31 ENCOUNTER — Telehealth: Payer: Self-pay

## 2016-03-31 NOTE — Telephone Encounter (Signed)
This is a dental issue.

## 2016-03-31 NOTE — Telephone Encounter (Signed)
x1 month infection in lower gum. Started as a white pimple. Dentist thinks originates from root canal. Dentist placed patient on high dose of PCN. 1st round did not work, currently on 2nd round. No pain, but white pimple has appeared again on gum. Patient requesting to know if she should f/u w/ Dr. Renold Genta to see and treat.

## 2016-03-31 NOTE — Telephone Encounter (Signed)
Left a message for patient @ [phone].  If she has further questions, she is to call the office back.  Otherwise, she is to contact her dentist for further assistance.

## 2016-04-04 ENCOUNTER — Ambulatory Visit (INDEPENDENT_AMBULATORY_CARE_PROVIDER_SITE_OTHER): Payer: BLUE CROSS/BLUE SHIELD | Admitting: Internal Medicine

## 2016-04-04 ENCOUNTER — Encounter: Payer: Self-pay | Admitting: Internal Medicine

## 2016-04-04 VITALS — BP 110/68 | HR 64 | Temp 97.8°F | Ht 67.0 in | Wt 143.0 lb

## 2016-04-04 DIAGNOSIS — R350 Frequency of micturition: Secondary | ICD-10-CM

## 2016-04-04 DIAGNOSIS — B3731 Acute candidiasis of vulva and vagina: Secondary | ICD-10-CM

## 2016-04-04 DIAGNOSIS — K0510 Chronic gingivitis, plaque induced: Secondary | ICD-10-CM | POA: Diagnosis not present

## 2016-04-04 DIAGNOSIS — R300 Dysuria: Secondary | ICD-10-CM

## 2016-04-04 DIAGNOSIS — B373 Candidiasis of vulva and vagina: Secondary | ICD-10-CM | POA: Diagnosis not present

## 2016-04-04 LAB — POCT URINALYSIS DIPSTICK
BILIRUBIN UA: NEGATIVE
Blood, UA: NEGATIVE
Glucose, UA: NEGATIVE
KETONES UA: NEGATIVE
LEUKOCYTES UA: NEGATIVE
Nitrite, UA: NEGATIVE
PROTEIN UA: NEGATIVE
Spec Grav, UA: <=1.005
UROBILINOGEN UA: NEGATIVE
pH, UA: 6

## 2016-04-04 MED ORDER — DOXYCYCLINE HYCLATE 100 MG PO TABS: 100.0000 mg | ORAL_TABLET | Freq: Two times a day (BID) | ORAL | 0 refills | Status: DC

## 2016-04-04 MED ORDER — TERCONAZOLE 0.8 % VA CREA: 1.0000 | TOPICAL_CREAM | Freq: Every day | VAGINAL | 99 refills | Status: DC

## 2016-04-04 NOTE — Patient Instructions (Signed)
Doxycycline 100 mg twice daily for 7 days. Terazol 7 vaginal cream daily at bedtime 7 days with when necessary 1 year refill. Pimple on gum does not dissipate within the next 7-10 days, return to endodontist

## 2016-04-04 NOTE — Progress Notes (Signed)
   Subjective:    Patient ID: Gabriela Green, female    DOB: [birth year], 65 y.o.   MRN: [id number]  HPI Patient has recently had a root canal redone by Dr. Remonia Richter. Had developed a pimple-like lesion left lateral mandibular gingival area near first molar. She had 2 rounds of penicillin. Developed vaginal itching and urinary frequency. Tried over-the-counter Monistat without much success. Still feels irritated with urinary frequency and urgency.  She has remote history of mitral valve prolapse and was worried about possible endocarditis with this dental infection. Reassured her that this was unlikely.    Review of Systems see above     Objective:   Physical Exam Dipstick urinalysis today is completely normal. She does have a small pimple-like lesion left lateral mandibular gingival area with some surrounding erythema.  Did not do vaginal exam today. Dr. Clovia Cuff is her GYN physician.     Assessment & Plan:  Probable Candida vaginitis  Gingivitis  Plan: Doxycycline 100 mg twice daily for 7 days. Terazol 7 vaginal cream daily at bedtime 7 days with when necessary 1 year refill. If dental infection does not clear up she needs to go back and see endodontist.  25 minutes spent with patient

## 2016-07-29 ENCOUNTER — Ambulatory Visit: Payer: BLUE CROSS/BLUE SHIELD | Admitting: Internal Medicine

## 2016-08-04 ENCOUNTER — Encounter: Payer: Self-pay | Admitting: Internal Medicine

## 2016-08-04 ENCOUNTER — Ambulatory Visit (INDEPENDENT_AMBULATORY_CARE_PROVIDER_SITE_OTHER): Payer: BLUE CROSS/BLUE SHIELD | Admitting: Internal Medicine

## 2016-08-04 ENCOUNTER — Ambulatory Visit: Payer: BLUE CROSS/BLUE SHIELD | Admitting: Internal Medicine

## 2016-08-04 VITALS — BP 118/80 | HR 75 | Temp 97.5°F | Wt 142.0 lb

## 2016-08-04 DIAGNOSIS — J069 Acute upper respiratory infection, unspecified: Secondary | ICD-10-CM

## 2016-08-04 MED ORDER — AZITHROMYCIN 250 MG PO TABS: ORAL_TABLET | ORAL | 1 refills | Status: DC

## 2016-08-04 MED ORDER — FLUCONAZOLE 150 MG PO TABS: 150.0000 mg | ORAL_TABLET | Freq: Once | ORAL | 1 refills | Status: AC

## 2016-08-04 NOTE — Progress Notes (Signed)
   Subjective:    Patient ID: Gabriela Green, female    DOB: 22-Mar-1951, 65 y.o.   MRN: [id number]  HPI 65 year old Female has had to come in today to get flu vaccine but she came down with a respiratory infection over the Thanksgiving holiday. No fever. Has malaise and fatigue cough and congestion. Sounds hoarse.    Review of Systems see above     Objective:   Physical Exam  Skin warm and dry. Nodes none. Neck is supple. Chest clear to auscultation without rales or wheezing. Pharynx is very slightly injected. TMs are slightly full.      Assessment & Plan:  Acute URI  Plan: Zithromax Z-Pak take 2 tablets day one followed by 1 tablet days 2 through 5. Diflucan 150 mg if needed for Candida vaginitis while on antibiotic therapy. One refill on Diflucan. Rest and drink plenty of fluids. Call if not better in 7-10 days or sooner if worse. One refill given on Zithromax Z-Pak.

## 2016-08-04 NOTE — Patient Instructions (Signed)
Zithromax Z-PAK take 2 tablets day one followed by 1 tablet days 2 through 5. Diflucan if needed for Candida vaginitis symptoms. Rest and drink plenty of fluids. 1 refill each on Diflucan and Zithromax Z-Pak if needed.

## 2016-09-17 ENCOUNTER — Other Ambulatory Visit: Payer: Self-pay | Admitting: Internal Medicine

## 2016-09-17 DIAGNOSIS — Z1231 Encounter for screening mammogram for malignant neoplasm of breast: Secondary | ICD-10-CM

## 2016-10-15 ENCOUNTER — Ambulatory Visit
Admission: RE | Admit: 2016-10-15 | Discharge: 2016-10-15 | Disposition: A | Discharge status: Home / Self Care | Payer: BLUE CROSS/BLUE SHIELD | Source: Ambulatory Visit | Attending: Internal Medicine | Admitting: Internal Medicine

## 2016-10-15 DIAGNOSIS — Z1231 Encounter for screening mammogram for malignant neoplasm of breast: Secondary | ICD-10-CM

## 2016-10-18 DIAGNOSIS — A09 Infectious gastroenteritis and colitis, unspecified: Secondary | ICD-10-CM

## 2016-10-20 ENCOUNTER — Telehealth: Payer: Self-pay | Admitting: Internal Medicine

## 2016-10-20 NOTE — Telephone Encounter (Signed)
Called complaining of gastroenteritis symptoms onset early this morning. Call in Zofran 4 mg #20 one by mouth every 8 hours when necessary nausea and vomiting. Has had nausea vomiting low-grade fever and diarrhea. Clear liquids and advance diet slowly.

## 2017-04-20 ENCOUNTER — Ambulatory Visit (INDEPENDENT_AMBULATORY_CARE_PROVIDER_SITE_OTHER): Payer: BLUE CROSS/BLUE SHIELD | Admitting: Internal Medicine

## 2017-04-20 ENCOUNTER — Encounter: Payer: Self-pay | Admitting: Internal Medicine

## 2017-04-20 VITALS — BP 112/78 | HR 65 | Temp 97.3°F | Wt 142.0 lb

## 2017-04-20 DIAGNOSIS — R197 Diarrhea, unspecified: Secondary | ICD-10-CM

## 2017-04-20 DIAGNOSIS — Z8679 Personal history of other diseases of the circulatory system: Secondary | ICD-10-CM

## 2017-04-20 DIAGNOSIS — R5383 Other fatigue: Secondary | ICD-10-CM

## 2017-04-20 DIAGNOSIS — R072 Precordial pain: Secondary | ICD-10-CM | POA: Diagnosis not present

## 2017-04-20 DIAGNOSIS — R0602 Shortness of breath: Secondary | ICD-10-CM | POA: Diagnosis not present

## 2017-04-20 LAB — CBC WITH DIFFERENTIAL/PLATELET
BASOS PCT: 1 %
Basophils Absolute: 56 cells/uL (ref 0–200)
Eosinophils Absolute: 112 cells/uL (ref 15–500)
Eosinophils Relative: 2 %
HCT: 40.3 % (ref 35.0–45.0)
Hemoglobin: 13.5 g/dL (ref 11.7–15.5)
LYMPHS PCT: 23 %
Lymphs Abs: 1288 cells/uL (ref 850–3900)
MCH: 31.5 pg (ref 27.0–33.0)
MCHC: 33.5 g/dL (ref 32.0–36.0)
MCV: 94.2 fL (ref 80.0–100.0)
MONOS PCT: 8 %
MPV: 9.6 fL (ref 7.5–12.5)
Monocytes Absolute: 448 cells/uL (ref 200–950)
Neutro Abs: 3696 cells/uL (ref 1500–7800)
Neutrophils Relative %: 66 %
PLATELETS: 329 10*3/uL (ref 140–400)
RBC: 4.28 MIL/uL (ref 3.80–5.10)
RDW: 12.8 % (ref 11.0–15.0)
WBC: 5.6 10*3/uL (ref 3.8–10.8)

## 2017-04-20 LAB — COMPLETE METABOLIC PANEL WITH GFR
ALT: 11 U/L (ref 6–29)
AST: 12 U/L (ref 10–35)
Albumin: 4 g/dL (ref 3.6–5.1)
Alkaline Phosphatase: 34 U/L (ref 33–130)
BILIRUBIN TOTAL: 0.6 mg/dL (ref 0.2–1.2)
BUN: 11 mg/dL (ref 7–25)
CO2: 28 mmol/L (ref 20–32)
Calcium: 9.5 mg/dL (ref 8.6–10.4)
Chloride: 101 mmol/L (ref 98–110)
Creat: 0.64 mg/dL (ref 0.50–0.99)
GFR, Est African American: >89 mL/min (ref >=60)
GFR, Est Non African American: >89 mL/min (ref >=60)
Glucose, Bld: 88 mg/dL (ref 65–99)
POTASSIUM: 4.2 mmol/L (ref 3.5–5.3)
SODIUM: 136 mmol/L (ref 135–146)
TOTAL PROTEIN: 6.6 g/dL (ref 6.1–8.1)

## 2017-04-20 LAB — TSH: TSH: 2.36 mIU/L

## 2017-04-20 LAB — T4, FREE: Free T4: 1.2 ng/dL (ref 0.8–1.8)

## 2017-04-20 NOTE — Progress Notes (Signed)
   Subjective:    Patient ID: Gabriela Green, female    DOB: 1951/08/07, 66 y.o.   MRN: [id number]  HPI 66 year old Female who is scheduled to have a dental implant tomorrow by Dr. Geralynn Ochs. He says she has a remote history of mitral valve prolapse and used to take SBE prophylaxis but of course that is not recommended now for mitral valve prolapse. She is concerned about endocarditis. She has noticed she is short of breath when she goes upstairs which she finds unusual. She says she is quite active and has never had this before. She has had some vague substernal chest discomfort without water brash. Thought it might be indigestion but did not seem to be triggered by any particular foods.  She has a long-standing history of irritable bowel syndrome and has been given Levsin for that. Is worried about C. difficile because a coworker had it and advised her it could be contagious. She's not had recent antibiotics.  Has felt fatigued. 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    Review of Systems see above     Objective:   Physical Exam Skin warm and dry. No cervical adenopathy. Neck is supple. No thyromegaly. Chest clear to auscultation. Cardiac exam regular rate and rhythm normal S1 and S2. No clicks or murmurs detected. Extremities without edema. Pulses in feet are intact. No lower extremity edema.       Assessment & Plan:  I think she is anxious over upcoming dental implant. She wants to make sure her heart is okay. EKG is within normal limits. She is going to have a 2-D echocardiogram tomorrow and a chest x-ray and lab work was drawn today. I would like for her to put off this procedure or week or so. She may take Nexium over-the-counter 2  Capsules daily. She is to have a chest x-ray. If she has more diarrhea she can submit stool studies.. Labs are drawn and pending.

## 2017-04-20 NOTE — Patient Instructions (Signed)
2-D echocardiogram to be done this week as well as chest x-ray. Labs drawn and pending.

## 2017-04-21 ENCOUNTER — Ambulatory Visit (HOSPITAL_COMMUNITY)
Admission: RE | Admit: 2017-04-21 | Discharge: 2017-04-21 | Disposition: A | Discharge status: Home / Self Care | Payer: BLUE CROSS/BLUE SHIELD | Source: Ambulatory Visit | Attending: Internal Medicine | Admitting: Internal Medicine

## 2017-04-21 DIAGNOSIS — I361 Nonrheumatic tricuspid (valve) insufficiency: Secondary | ICD-10-CM | POA: Diagnosis not present

## 2017-04-21 DIAGNOSIS — Z8679 Personal history of other diseases of the circulatory system: Secondary | ICD-10-CM | POA: Insufficient documentation

## 2017-04-21 LAB — ECHOCARDIOGRAM COMPLETE
CHL CUP RV SYS PRESS: 25 mmHg
E decel time: 211 msec
EERAT: 6.89
FS: 30 % (ref 28–44)
IV/PV OW: 1.01
LA ID, A-P, ES: 25 mm
LA diam index: 1.43 cm/m2
LA vol A4C: 40.6 ml
LA vol index: 25.7 mL/m2
LA vol: 45 mL
LDCA: 3.46 cm2
LEFT ATRIUM END SYS DIAM: 25 mm
LV TDI E'LATERAL: 11.7
LV sys vol index: 15 mL/m2
LVDIAVOL: 71 mL (ref 46–106)
LVDIAVOLIN: 40 mL/m2
LVEEAVG: 6.89
LVEEMED: 6.89
LVELAT: 11.7 cm/s
LVOT SV: 77 mL
LVOT VTI: 22.3 cm
LVOT diameter: 21 mm
LVOT peak grad rest: 3 mmHg
LVOTPV: 90.3 cm/s
LVSYSVOL: 25 mL (ref 14–42)
MV Dec: 211
MV Peak grad: 3 mmHg
MV pk E vel: 80.6 m/s
MVPKAVEL: 66.4 m/s
PW: 10.1 mm — AB (ref 0.6–1.1)
RV LATERAL S' VELOCITY: 13.4 cm/s
Reg peak vel: 233 cm/s
Simpson's disk: 64
Stroke v: 45 ml
TAPSE: 24.2 mm
TDI e' medial: 9.36
TR max vel: 233 cm/s

## 2017-04-21 NOTE — Progress Notes (Signed)
  Echocardiogram 2D Echocardiogram has been performed.  Arlyn Bumpus L Androw 04/21/2017, 9:58 AM

## 2017-05-07 ENCOUNTER — Telehealth: Payer: Self-pay

## 2017-05-07 NOTE — Telephone Encounter (Signed)
Pts dental office called because pt is questioning if she needs prophylactic antibiotic or not. Per her chart and the clear ence given for the procedure the answer would be no to my knowledge. I advised them that the provider was not in yet but they said she is in the chair for her procedure and started questioning it so they called. I advised them that her chart indicated no she did not but I would talk with Dr. Renold Genta when she arrived incase anything changed for future reference.

## 2017-05-07 NOTE — Telephone Encounter (Signed)
She has no mitral valve prolapse on Echo and no SBE prophylaxis is required.

## 2017-05-25 ENCOUNTER — Encounter: Payer: Self-pay | Admitting: Internal Medicine

## 2017-05-25 ENCOUNTER — Ambulatory Visit
Admission: RE | Admit: 2017-05-25 | Discharge: 2017-05-25 | Disposition: A | Discharge status: Home / Self Care | Payer: BLUE CROSS/BLUE SHIELD | Source: Ambulatory Visit | Attending: Internal Medicine | Admitting: Internal Medicine

## 2017-05-25 DIAGNOSIS — Z8679 Personal history of other diseases of the circulatory system: Secondary | ICD-10-CM

## 2017-05-25 DIAGNOSIS — R5383 Other fatigue: Secondary | ICD-10-CM

## 2017-06-01 ENCOUNTER — Other Ambulatory Visit: Payer: BLUE CROSS/BLUE SHIELD | Admitting: Internal Medicine

## 2017-06-01 DIAGNOSIS — Z13220 Encounter for screening for lipoid disorders: Secondary | ICD-10-CM

## 2017-06-01 LAB — LIPID PANEL
CHOL/HDL RATIO: 2.5 (calc) (ref <5.0)
CHOLESTEROL: 182 mg/dL (ref <200)
HDL: 73 mg/dL (ref >50)
LDL CHOLESTEROL (CALC): 89 mg/dL
Non-HDL Cholesterol (Calc): 109 mg/dL (calc) (ref <130)
Triglycerides: 106 mg/dL (ref <150)

## 2017-06-01 LAB — EXTRA LAV TOP TUBE

## 2017-06-02 ENCOUNTER — Ambulatory Visit (INDEPENDENT_AMBULATORY_CARE_PROVIDER_SITE_OTHER): Payer: BLUE CROSS/BLUE SHIELD | Admitting: Internal Medicine

## 2017-06-02 VITALS — BP 110/70 | HR 77 | Temp 98.5°F | Wt 142.0 lb

## 2017-06-02 DIAGNOSIS — Z8679 Personal history of other diseases of the circulatory system: Secondary | ICD-10-CM | POA: Diagnosis not present

## 2017-06-02 DIAGNOSIS — Z23 Encounter for immunization: Secondary | ICD-10-CM

## 2017-06-02 DIAGNOSIS — L290 Pruritus ani: Secondary | ICD-10-CM | POA: Diagnosis not present

## 2017-06-02 MED ORDER — CLOTRIMAZOLE-BETAMETHASONE 1-0.05 % EX CREA: 1.0000 "application " | TOPICAL_CREAM | Freq: Two times a day (BID) | CUTANEOUS | 0 refills | Status: DC

## 2017-06-03 ENCOUNTER — Encounter: Payer: Self-pay | Admitting: Internal Medicine

## 2017-06-03 NOTE — Progress Notes (Signed)
   Subjective:    Patient ID: Gabriela Green, female    DOB: [birth year], 66 y.o.   MRN: [id number]  HPI She recently had a chest x-ray that documented aortic atherosclerosis. She says father had history of coronary disease but was an alcoholic. He apparently died of pneumonia.   Her recent lipid panel was entirely normal. This workup was done because she was getting ready to have some dental work and had some chest discomfort. She recalles that she been told she had mitral valve prolapse in the past but recent 2-D echocardiogram showed normal left ventricular ejection fraction. No evidence of diastolic dysfunction. Aortic valve and mitral valves were normal. There was only mild mitral regurgitation. Mild tricuspid regurgitation.   Patient thought she might need to have dental prophylaxis before her recent dental procedure and that is one reason we undertook this workup.  Explained to patient that radiologists were frequently reporting aortic atherosclerosis.  She does not smoke. Social alcohol consumption. Doesn't exercise a great deal.  EKG done in August was normal prior to her dental implant surgery.  She also needs some cream for perianal itching. Lotrisone will be prescribed.  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 She is divorced. 2 adult daughters.    Review of Systems see above     Objective:   Physical Exam  Not examined. His been 15 minutes talking with her about these issues.      Assessment & Plan:  After speaking with her at length, I think the best course of action is a Cardiology referral to see if she needs further evaluation or treatment at this point in time.

## 2017-06-03 NOTE — Patient Instructions (Signed)
Suggest cardiology consultation regarding aortic atherosclerosis and? Further workup. Lotrisone cream for rectal itching. Flu vaccine given.

## 2017-06-03 NOTE — Addendum Note (Signed)
Addended by: Drucilla Schmidt on: 06/03/2017 12:33 PM   Modules accepted: Orders

## 2017-06-08 DIAGNOSIS — N200 Calculus of kidney: Secondary | ICD-10-CM

## 2017-06-08 HISTORY — DX: Calculus of kidney: N20.0

## 2017-06-26 ENCOUNTER — Encounter: Payer: Self-pay | Admitting: Cardiology

## 2017-06-29 ENCOUNTER — Ambulatory Visit (INDEPENDENT_AMBULATORY_CARE_PROVIDER_SITE_OTHER): Payer: BLUE CROSS/BLUE SHIELD | Admitting: Internal Medicine

## 2017-06-29 ENCOUNTER — Encounter: Payer: Self-pay | Admitting: Internal Medicine

## 2017-06-29 ENCOUNTER — Ambulatory Visit
Admission: RE | Admit: 2017-06-29 | Discharge: 2017-06-29 | Disposition: A | Discharge status: Home / Self Care | Payer: BLUE CROSS/BLUE SHIELD | Source: Ambulatory Visit | Attending: Internal Medicine | Admitting: Internal Medicine

## 2017-06-29 VITALS — BP 120/80 | HR 68 | Temp 97.8°F | Wt 143.0 lb

## 2017-06-29 DIAGNOSIS — R1030 Lower abdominal pain, unspecified: Secondary | ICD-10-CM

## 2017-06-29 DIAGNOSIS — M545 Low back pain, unspecified: Secondary | ICD-10-CM

## 2017-06-29 DIAGNOSIS — R319 Hematuria, unspecified: Secondary | ICD-10-CM

## 2017-06-29 DIAGNOSIS — R300 Dysuria: Secondary | ICD-10-CM

## 2017-06-29 DIAGNOSIS — R350 Frequency of micturition: Secondary | ICD-10-CM | POA: Diagnosis not present

## 2017-06-29 LAB — POC URINALSYSI DIPSTICK (AUTOMATED)
BILIRUBIN UA: NEGATIVE
GLUCOSE UA: NEGATIVE
KETONES UA: NEGATIVE
Leukocytes, UA: NEGATIVE
NITRITE UA: NEGATIVE
PH UA: 6 (ref 5.0–8.0)
Protein, UA: NEGATIVE
SPEC GRAV UA: 1.015 (ref 1.010–1.025)
Urobilinogen, UA: 0.2 E.U./dL

## 2017-06-29 MED ORDER — HYDROCODONE-ACETAMINOPHEN 5-325 MG PO TABS: 1.0000 | ORAL_TABLET | Freq: Four times a day (QID) | ORAL | 0 refills | Status: DC | PRN

## 2017-06-29 MED ORDER — ONDANSETRON HCL 4 MG PO TABS: 4.0000 mg | ORAL_TABLET | Freq: Three times a day (TID) | ORAL | 0 refills | Status: DC | PRN

## 2017-06-29 MED ORDER — TAMSULOSIN HCL 0.4 MG PO CAPS: 0.4000 mg | ORAL_CAPSULE | Freq: Every day | ORAL | 0 refills | Status: DC

## 2017-06-29 NOTE — Addendum Note (Signed)
Addended by: Mady Haagensen on: 06/29/2017 04:17 PM   Modules accepted: Orders

## 2017-06-29 NOTE — Addendum Note (Signed)
Addended by: Elby Showers on: 06/29/2017 04:38 PM   Modules accepted: Orders

## 2017-06-29 NOTE — Patient Instructions (Signed)
Patient to have CT abdomen and pelvis with stone protocol.

## 2017-06-29 NOTE — Progress Notes (Addendum)
   Subjective:    Patient ID: Gabriela Green, female    DOB: 1951-05-07, 66 y.o.   MRN: [id number]  HPI 66 year old White Female in good health called urgently saying that she had urinary frequency this morning that progressed to right lower back and generalized lower abdominal pain.  Michela Pitcher it was extremely uncomfortable and she had never experienced anything like it.  No fever or shaking chills.  No nausea or vomiting but is having some difficulty sitting still and is anxious.  No prior history of kidney stones.  No history of frequent urinary tract infections.  Dipstick UA showed moderate occult blood.  Urine was sent for culture.    Review of Systems see above     Objective:   Physical Exam Abdomen is soft, nondistended without hepatosplenomegaly or masses.  There is some right lower quadrant tenderness without rebound tenderness.  Complaining of right lower back tenderness but not especially tender on direct palpation.       Assessment & Plan:  Hematuria  Right back and abdominal pain  Suspect kidney stone  Plan: Proceed with CT abdomen and pelvis with stone protocol.  Urine culture sent.  Has 4 mm kidney stone right UVJ causing moderate hydronephrosis.  Patient aware.  Prescribe Norco 5/325 1 p.o. every 6 hours as needed pain.  Flomax 0.4 mg daily.   Zofran 4 mg tablets 1 p.o. 4 times daily as needed nausea.  Appointment to see Urologist tomorrow.

## 2017-07-01 LAB — URINE CULTURE
MICRO NUMBER: 81178024
SPECIMEN QUALITY:: ADEQUATE

## 2017-07-08 NOTE — Progress Notes (Signed)
Cardiology Office Note   Date:  07/13/2017   ID:  [name], [name] Mar 29, 1951, MRN [id number]  PCP:  Gabriela Showers, MD  Cardiologist:   Peter Martinique, MD   Chief Complaint  Patient presents with  . Mitral Valve Prolapse    abnormal CXR finding      History of Present Illness: Gabriela Green is a 66 y.o. female who is seen at the request of Dr. Renold Genta for evaluation of  finding of aortic atherosclerosis on CXR. She is generally in good health. She states she was told as a child that she had MV prolapse although no formal evaluation. Took SBE prophylaxis for years until about 5 years ago. No history of HTN, HLD, DM, tobacco use, or family history of CAD. Notes recently she had several dental procedures done including a root canal. Was concerned about infection so had a CXR done. CXR report stated she had aortic atherosclerosis. She denies any chest pain, dyspnea, palpitations. Considers herself to be in good health.    Past Medical History:  Diagnosis Date  . Allergy   . HSV-2 (herpes simplex virus 2) infection   . Interstitial cystitis   . TMJ syndrome     Past Surgical History:  Procedure Laterality Date  . ABDOMINAL HYSTERECTOMY    . BREAST ENHANCEMENT SURGERY    . BREAST ENHANCEMENT SURGERY Bilateral    had reduction and implants, then later implants removed and replaced  . MICRODISCECTOMY LUMBAR  2/96   L5-S1     Current Outpatient Medications  Medication Sig Dispense Refill  . Biotin 5000 MCG TABS Take 1 tablet by mouth daily.    . Cholecalciferol (VITAMIN D) 2000 units CAPS Take 1 capsule by mouth daily.    . clobetasol (OLUX) 0.05 % topical foam   0  . estradiol (ESTRACE) 1 MG tablet Take 1 tablet (1 mg total) by mouth daily. 30 tablet 11  . hyoscyamine (LEVSIN/SL) 0.125 MG SL tablet 1 tablet under tongue one half hour before meals and at bedtime as needed for irritable bowel syndrome 120 tablet 5  . ibuprofen (ADVIL,MOTRIN) 600 MG tablet Take 1 tablet (600 mg  total) by mouth every 6 (six) hours as needed. 30 tablet 0  . Misc Natural Products (PROGESTERONE) 1000 MG/60GM CREA Apply 1 application topically daily.      No current facility-administered medications for this visit.     Allergies:   Patient has no known allergies.    Social History:  The patient  reports that  has never smoked. she has never used smokeless tobacco. She reports that she does not drink alcohol or use drugs.   Family History:  The patient's family history includes Diabetes in her sister; Heart attack in her father; Hyperlipidemia in her mother.    ROS:  Please see the history of present illness.   Otherwise, review of systems are positive for none.   All other systems are reviewed and negative.    PHYSICAL EXAM: VS:  BP 120/72   Pulse 71   Ht 5\' 7"  (1.702 m)   Wt 141 lb (64 kg)   BMI 22.08 kg/m  , BMI Body mass index is 22.08 kg/m. GEN: Well nourished, well developed, in no acute distress  HEENT: normal  Neck: no JVD, carotid bruits, or masses Cardiac: RRR; no murmurs, rubs, or gallops,no edema  Respiratory:  clear to auscultation bilaterally, normal work of breathing GI: soft, nontender, nondistended, + BS MS: no deformity  or atrophy  Skin: warm and dry, no rash Neuro:  Strength and sensation are intact Psych: euthymic mood, full affect   EKG:  EKG is not ordered today. The ekg ordered 04/22/17  demonstrates NSR with normal Ecg. I have personally reviewed and interpreted this study.    Recent Labs: 04/20/2017: ALT 11; BUN 11; Creat 0.64; Hemoglobin 13.5; Platelets 329; Potassium 4.2; Sodium 136; TSH 2.36    Lipid Panel    Component Value Date/Time   CHOL 182 06/01/2017 0917   TRIG 106 06/01/2017 0917   HDL 73 06/01/2017 0917   CHOLHDL 2.5 06/01/2017 0917   VLDL 17 10/16/2015 0934   LDLCALC 86 10/16/2015 0934      Wt Readings from Last 3 Encounters:  07/13/17 141 lb (64 kg)  06/29/17 143 lb (64.9 kg)  06/02/17 142 lb (64.4 kg)       Other studies Reviewed:  CHEST  2 VIEW  COMPARISON:  September 09, 2013  FINDINGS: Lungs are clear. Heart size and pulmonary vascularity are normal. No adenopathy. There is aortic atherosclerosis. There is lower thoracic levoscoliosis.  IMPRESSION: No edema or consolidation.  There is aortic atherosclerosis.  Aortic Atherosclerosis (ICD10-I70.0).   Electronically Signed   By: Lowella Grip III M.D.   On: 05/25/2017 15:24  Additional studies/ records that were reviewed today include:  Echo 04/21/17: Study Conclusions  - Left ventricle: The cavity size was normal. Systolic function was   normal. The estimated ejection fraction was in the range of 55%   to 60%. Wall motion was normal; there were no regional wall   motion abnormalities. Left ventricular diastolic function   parameters were normal. - Aortic valve: Transvalvular velocity was within the normal range.   There was no stenosis. There was no regurgitation. - Mitral valve: Transvalvular velocity was within the normal range.   There was no evidence for stenosis. There was mild regurgitation. - Right ventricle: The cavity size was normal. Wall thickness was   normal. Systolic function was normal. - Atrial septum: No defect or patent foramen ovale was identified   by color flow Doppler. - Tricuspid valve: There was mild regurgitation. - Pulmonary arteries: Systolic pressure was within the normal   range. PA peak pressure: 25 mm Hg (S).   ASSESSMENT AND PLAN:  1.  Abnormal CXR with report suggesting aortic atherosclerosis. I personally reviewed CXR and I do not detect significant calcification. I also reviewed her recent abdominal/pelvic CT. The entire visualized aorta from above the diaphragm distally shows no atherosclerosis or calcification. She has no cardiac symptoms. I reassured her that no further testing or therapy needed at this time. 2. ? History of MV prolapse. Recent Echo shows only mild MR ( no  murmur on exam ). No prolapse.    Current medicines are reviewed at length with the patient today.  The patient does not have concerns regarding medicines.  The following changes have been made:  no change  Labs/ tests ordered today include: none No orders of the defined types were placed in this encounter.    Disposition:   FU with me PRN  Signed, Peter Martinique, MD  07/13/2017 12:59 PM    [facility] [address] [place], [place], [address] Phone [phone], Fax [phone]

## 2017-07-13 ENCOUNTER — Ambulatory Visit: Payer: BLUE CROSS/BLUE SHIELD | Admitting: Cardiology

## 2017-07-13 ENCOUNTER — Encounter: Payer: Self-pay | Admitting: Cardiology

## 2017-07-13 VITALS — BP 120/72 | HR 71 | Ht 67.0 in | Wt 141.0 lb

## 2017-07-13 DIAGNOSIS — R9389 Abnormal findings on diagnostic imaging of other specified body structures: Secondary | ICD-10-CM

## 2017-07-13 NOTE — Progress Notes (Signed)
Thanks so much. She is anxious.

## 2017-07-21 ENCOUNTER — Ambulatory Visit: Payer: BLUE CROSS/BLUE SHIELD | Admitting: Internal Medicine

## 2017-08-20 ENCOUNTER — Encounter: Payer: Self-pay | Admitting: Internal Medicine

## 2017-08-20 ENCOUNTER — Ambulatory Visit: Payer: BLUE CROSS/BLUE SHIELD | Admitting: Internal Medicine

## 2017-08-20 VITALS — BP 110/60 | HR 72 | Ht 67.0 in | Wt 141.0 lb

## 2017-08-20 DIAGNOSIS — K582 Mixed irritable bowel syndrome: Secondary | ICD-10-CM

## 2017-08-20 DIAGNOSIS — M6289 Other specified disorders of muscle: Secondary | ICD-10-CM | POA: Diagnosis not present

## 2017-08-20 NOTE — Patient Instructions (Signed)
Today we are giving you a high fiber diet booklet to read and follow.  Please take Align daily for a month.  Samples and coupon provided.  Take one tablespoon of benefiber daily, handout provided.   Normal BMI (Body Mass Index- based on height and weight) is between 19 and 25. Your BMI today is Body mass index is 22.08 kg/m. Marland Kitchen Please consider follow up  regarding your BMI with your Primary Care Provider.   I appreciate the opportunity to care for you. Silvano Rusk , MD, [facility]

## 2017-08-20 NOTE — Progress Notes (Signed)
Gabriela Green 66 y.o. June 06, 1951 [id number] Referred by: Elby Showers, MD  Assessment & Plan:   Encounter Diagnoses  Name Primary?  . Irritable bowel syndrome with both constipation and diarrhea Yes  . Pelvic floor dysfunction ?     Her history is consistent with irritable bowel syndrome.  Question some disordered defecation or pelvic floor problem with respect to findings on rectal exam today.  I have recommended she take a tablespoon of Benefiber daily, try the probiotic align for 1 month and continue if desired, and to increase fiber in her diet.  I think her primary problem is inconsistent defecation and constipation more so than diarrhea, and that if she gets more regular defecation she will have less problems overall.  It is fine to continue the intermittent as needed hyoscyamine.  She is due for a routine preventive colonoscopy in the spring.  I would not change that timing based upon current information.  I appreciate the opportunity to care for this patient. CC: Elby Showers, MD  Subjective:   Chief Complaint:  HPI The patient is a very nice divorced white woman with a 3-year history of intermittent irregular defecation.  She describes both diarrhea and constipation.  In talking to her she travels a lot with her business, and she will sometimes go up to 4 or 5 days without significant defecation.  Once she starts to go she will have diarrhea as she unloads at times.  There are other episodes of diarrhea after greasy food she thinks.  No other clear triggers no significant use of artificial sweeteners, no milk or lactose intolerance.  She does not recall any medication changes being associated with this.  She did start to have a series of undiagnosed rashes about 3 years ago.  She has been trying to eat some yogurt and wonders about the probiotic content if that would help.  She does openly admit to having a poor diet, she said food is not that important to her and she will  eat hamburgers [place] fries etc. and generally eats when she is hungry which may not be 3 times a day it sounds like.  As far as travel is concerned that creates problems for her in contributes to her regular bowel habits she thinks.  Dr. Renold Genta prescribed hyoscyamine sublingual which the patient will take when she is eating a meal out professionally or socially.  It helps prevent urgent cramps and diarrhea.  She did have left-sided diverticulosis a screening colonoscopy in June 2009, Dr. Verl Blalock performing.  All other review of systems are negative with respect to the gastrointestinal tract.  She denies any significant urinary tract problems no leakage no stress urinary incontinence.  She thinks she might urinate a little bit more frequently than she used to.  There is a diagnosis of interstitial cystitis on her past medical history we did not discuss that today.  She also had a kidney stone and some hydronephrosis found in October which apparently spontaneously passed.  She did see urology, Dr. Gloriann Loan.  Notes reviewed. No Known Allergies Current Meds  Medication Sig  . Biotin 5000 MCG TABS Take 1 tablet by mouth daily.  . Cholecalciferol (VITAMIN D) 2000 units CAPS Take 1 capsule by mouth daily.  . clobetasol (OLUX) 0.05 % topical foam   . estradiol (ESTRACE) 1 MG tablet Take 1 tablet (1 mg total) by mouth daily.  . hyoscyamine (LEVSIN/SL) 0.125 MG SL tablet 1 tablet under tongue one half hour  before meals and at bedtime as needed for irritable bowel syndrome  . ibuprofen (ADVIL,MOTRIN) 600 MG tablet Take 1 tablet (600 mg total) by mouth every 6 (six) hours as needed.  . Misc Natural Products (PROGESTERONE) 1000 MG/60GM CREA Apply 1 application topically daily.    Past Medical History:  Diagnosis Date  . Allergy   . HSV-2 (herpes simplex virus 2) infection   . Interstitial cystitis   . Nephrolithiasis   . TMJ syndrome    Past Surgical History:  Procedure Laterality Date  . ABDOMINAL  HYSTERECTOMY    . BREAST ENHANCEMENT SURGERY    . BREAST ENHANCEMENT SURGERY Bilateral    had reduction and implants, then later implants removed and replaced  . COLONOSCOPY  2009  . MICRODISCECTOMY LUMBAR  2/96   L5-S1   Social History   Social History Narrative   Patient is divorced, she has 2 daughters.  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  No alcohol tobacco or drug use   Some caffeine   08/20/2017   family history includes Breast cancer in her other; Colon cancer in her maternal grandmother; Diabetes in her father and sister; Heart attack in her father; Heart disease in her paternal aunt; Hyperlipidemia in her mother; Ovarian cancer in her other.   Review of Systems As per HPI.  All other systems are negative at this time.  Objective:   Physical Exam @BP  110/60   Pulse 72   Ht 5\' 7"  (1.702 m)   Wt 141 lb (64 kg)   BMI 22.08 kg/m @  General:  Well-developed, well-nourished and in no acute distress Eyes:  anicteric. ENT:   Mouth and posterior pharynx free of lesions.  Neck:   supple w/o thyromegaly or mass.  Lungs: Clear to auscultation bilaterally. Heart:  S1S2, no rubs, murmurs, gallops. Abdomen:  soft, non-tender, no hepatosplenomegaly, hernia, or mass and BS+.  Rectal:  Patti [place], [place] present.  NL anoderm Absent anal wink NL resting tone and voluntary squeeze, no mass or rectocele Simulated defecation - inappropriate contraction w/o descent , appropriate abdominal wall contraction    Lymph:  no cervical or supraclavicular adenopathy. Extremities:   no edema, cyanosis or clubbing Skin   no rash. Neuro:  A&O x 3.  Psych:  appropriate mood and  Affect.   Data Reviewed: Lab Results  Component Value Date   WBC 5.6 04/20/2017   HGB 13.5 04/20/2017   HCT 40.3 04/20/2017   MCV 94.2 04/20/2017   PLT 329 04/20/2017    I have reviewed Dr. Verlene Mayer primary care notes and the urology notes from late 2018 labs in the  computer as well she had a normal TSH also

## 2017-09-30 ENCOUNTER — Other Ambulatory Visit: Payer: Self-pay

## 2017-09-30 MED ORDER — VALACYCLOVIR HCL 500 MG PO TABS: 500.0000 mg | ORAL_TABLET | Freq: Two times a day (BID) | ORAL | 11 refills | Status: DC

## 2017-10-22 ENCOUNTER — Other Ambulatory Visit: Payer: Self-pay | Admitting: Internal Medicine

## 2017-10-22 DIAGNOSIS — Z1231 Encounter for screening mammogram for malignant neoplasm of breast: Secondary | ICD-10-CM

## 2017-11-12 ENCOUNTER — Telehealth: Payer: Self-pay

## 2017-11-12 DIAGNOSIS — B373 Candidiasis of vulva and vagina: Secondary | ICD-10-CM

## 2017-11-12 MED ORDER — FLUCONAZOLE 150 MG PO TABS: 150.0000 mg | ORAL_TABLET | Freq: Once | ORAL | 1 refills | Status: AC

## 2017-11-12 NOTE — Telephone Encounter (Signed)
Patient called states she has a yeast infection and she is asking to have something called in to her pharmacy she said she doesn't like the cream she prefers a "tablet".

## 2017-11-12 NOTE — Telephone Encounter (Signed)
Escribed

## 2017-11-12 NOTE — Telephone Encounter (Signed)
Please call in Diflucan 150 mg tab with one refill 

## 2017-11-13 ENCOUNTER — Telehealth: Payer: Self-pay

## 2017-11-13 MED ORDER — FLUCONAZOLE 150 MG PO TABS: 150.0000 mg | ORAL_TABLET | Freq: Once | ORAL | 1 refills | Status: AC

## 2017-11-13 NOTE — Telephone Encounter (Signed)
Sent diflucan

## 2017-12-02 ENCOUNTER — Ambulatory Visit
Admission: RE | Admit: 2017-12-02 | Discharge: 2017-12-02 | Disposition: A | Discharge status: Home / Self Care | Payer: BLUE CROSS/BLUE SHIELD | Source: Ambulatory Visit | Attending: Internal Medicine | Admitting: Internal Medicine

## 2017-12-02 DIAGNOSIS — Z1231 Encounter for screening mammogram for malignant neoplasm of breast: Secondary | ICD-10-CM

## 2017-12-10 ENCOUNTER — Other Ambulatory Visit: Payer: Self-pay

## 2017-12-10 MED ORDER — HYOSCYAMINE SULFATE 0.125 MG SL SUBL: SUBLINGUAL_TABLET | SUBLINGUAL | 5 refills | Status: DC

## 2017-12-10 NOTE — Telephone Encounter (Signed)
Patient called to request a refill.  

## 2018-01-14 ENCOUNTER — Encounter: Payer: Self-pay | Admitting: Internal Medicine

## 2018-03-17 ENCOUNTER — Ambulatory Visit (AMBULATORY_SURGERY_CENTER): Payer: Self-pay | Admitting: *Deleted

## 2018-03-17 ENCOUNTER — Other Ambulatory Visit: Payer: Self-pay

## 2018-03-17 VITALS — Ht 67.0 in | Wt 143.2 lb

## 2018-03-17 DIAGNOSIS — Z1211 Encounter for screening for malignant neoplasm of colon: Secondary | ICD-10-CM

## 2018-03-17 NOTE — Progress Notes (Signed)
Denies allergies to eggs or soy products. Denies complications with sedation or anesthesia. Denies O2 use. Denies use of diet or weight loss medications.  Emmi instructions given for colonoscopy.  

## 2018-03-18 DIAGNOSIS — L308 Other specified dermatitis: Secondary | ICD-10-CM | POA: Diagnosis not present

## 2018-04-01 ENCOUNTER — Encounter: Payer: BLUE CROSS/BLUE SHIELD | Admitting: Internal Medicine

## 2018-04-21 DIAGNOSIS — K589 Irritable bowel syndrome without diarrhea: Secondary | ICD-10-CM | POA: Diagnosis not present

## 2018-04-21 DIAGNOSIS — M545 Low back pain: Secondary | ICD-10-CM | POA: Diagnosis not present

## 2018-04-21 DIAGNOSIS — Z01419 Encounter for gynecological examination (general) (routine) without abnormal findings: Secondary | ICD-10-CM | POA: Diagnosis not present

## 2018-04-21 DIAGNOSIS — Z6821 Body mass index (BMI) 21.0-21.9, adult: Secondary | ICD-10-CM | POA: Diagnosis not present

## 2018-04-21 DIAGNOSIS — R12 Heartburn: Secondary | ICD-10-CM | POA: Diagnosis not present

## 2018-04-29 ENCOUNTER — Encounter: Payer: Self-pay | Admitting: Internal Medicine

## 2018-04-30 ENCOUNTER — Encounter: Payer: Self-pay | Admitting: Internal Medicine

## 2018-04-30 ENCOUNTER — Ambulatory Visit (INDEPENDENT_AMBULATORY_CARE_PROVIDER_SITE_OTHER): Payer: PPO | Admitting: Internal Medicine

## 2018-04-30 VITALS — BP 120/80 | HR 74 | Temp 98.7°F | Ht 67.0 in | Wt 142.0 lb

## 2018-04-30 DIAGNOSIS — B3731 Acute candidiasis of vulva and vagina: Secondary | ICD-10-CM

## 2018-04-30 DIAGNOSIS — B373 Candidiasis of vulva and vagina: Secondary | ICD-10-CM | POA: Diagnosis not present

## 2018-04-30 DIAGNOSIS — N898 Other specified noninflammatory disorders of vagina: Secondary | ICD-10-CM | POA: Diagnosis not present

## 2018-04-30 DIAGNOSIS — R350 Frequency of micturition: Secondary | ICD-10-CM

## 2018-04-30 LAB — POCT WET PREP (WET MOUNT)

## 2018-04-30 LAB — POCT URINALYSIS DIPSTICK
APPEARANCE: NORMAL
BILIRUBIN UA: NEGATIVE
Blood, UA: NEGATIVE
GLUCOSE UA: NEGATIVE
Ketones, UA: NEGATIVE
Leukocytes, UA: NEGATIVE
Nitrite, UA: NEGATIVE
Odor: NORMAL
Protein, UA: NEGATIVE
Spec Grav, UA: 1.01 (ref 1.010–1.025)
Urobilinogen, UA: 0.2 E.U./dL
pH, UA: 6.5 (ref 5.0–8.0)

## 2018-04-30 MED ORDER — FLUCONAZOLE 150 MG PO TABS: 150.0000 mg | ORAL_TABLET | Freq: Once | ORAL | 1 refills | Status: AC

## 2018-05-03 NOTE — Progress Notes (Signed)
   Subjective:    Patient ID: Gabriela Green, female    DOB: 1951/01/05, 67 y.o.   MRN: [id number]  HPI Patient here today with complaint of vaginal itching and discomfort.  She took a Diflucan tablet that she had leftover from a previous prescription and it seemed to help some.    Review of Systems see above no heavy discharge     Objective:   Physical Exam Scant cream-colored discharge.  Wet prep shows a few budding yeast.  No clue cells.       Assessment & Plan:  Candida vaginitis  Plan: Repeat course of Diflucan 150 mg tablet with 1 refill.

## 2018-05-03 NOTE — Patient Instructions (Signed)
Diflucan 150 mg tablet with 1 refill.

## 2018-05-05 DIAGNOSIS — H40013 Open angle with borderline findings, low risk, bilateral: Secondary | ICD-10-CM | POA: Diagnosis not present

## 2018-05-05 DIAGNOSIS — H25813 Combined forms of age-related cataract, bilateral: Secondary | ICD-10-CM | POA: Diagnosis not present

## 2018-05-05 DIAGNOSIS — H43813 Vitreous degeneration, bilateral: Secondary | ICD-10-CM | POA: Diagnosis not present

## 2018-05-05 DIAGNOSIS — H40019 Open angle with borderline findings, low risk, unspecified eye: Secondary | ICD-10-CM | POA: Diagnosis not present

## 2018-05-05 DIAGNOSIS — H1859 Other hereditary corneal dystrophies: Secondary | ICD-10-CM | POA: Diagnosis not present

## 2018-05-13 ENCOUNTER — Encounter: Payer: Self-pay | Admitting: Internal Medicine

## 2018-05-13 ENCOUNTER — Ambulatory Visit (AMBULATORY_SURGERY_CENTER): Payer: PPO | Admitting: Internal Medicine

## 2018-05-13 VITALS — BP 114/67 | HR 55 | Temp 97.5°F | Resp 17 | Ht 67.0 in | Wt 143.0 lb

## 2018-05-13 DIAGNOSIS — Z1211 Encounter for screening for malignant neoplasm of colon: Secondary | ICD-10-CM | POA: Diagnosis not present

## 2018-05-13 DIAGNOSIS — K589 Irritable bowel syndrome without diarrhea: Secondary | ICD-10-CM | POA: Diagnosis not present

## 2018-05-13 DIAGNOSIS — Z86010 Personal history of colonic polyps: Secondary | ICD-10-CM | POA: Diagnosis not present

## 2018-05-13 MED ORDER — SODIUM CHLORIDE 0.9 % IV SOLN: 500.0000 mL | Freq: Once | INTRAVENOUS | Status: DC

## 2018-05-13 NOTE — Patient Instructions (Addendum)
No polyps, no cancer seen.  You do have a condition called diverticulosis - common and not usually a problem. Please read the handout provided.  Next routine colonoscopy or other screening test in 10 years - 2029  I appreciate the opportunity to care for you. Gatha Mayer, MD, FACG   YOU HAD AN ENDOSCOPIC PROCEDURE TODAY AT [facility]:   Refer to the procedure report that was given to you for any specific questions about what was found during the examination.  If the procedure report does not answer your questions, please call your gastroenterologist to clarify.  If you requested that your care partner not be given the details of your procedure findings, then the procedure report has been included in a sealed envelope for you to review at your convenience later.  YOU SHOULD EXPECT: Some feelings of bloating in the abdomen. Passage of more gas than usual.  Walking can help get rid of the air that was put into your GI tract during the procedure and reduce the bloating. If you had a lower endoscopy (such as a colonoscopy or flexible sigmoidoscopy) you may notice spotting of blood in your stool or on the toilet paper. If you underwent a bowel prep for your procedure, you may not have a normal bowel movement for a few days.  Please Note:  You might notice some irritation and congestion in your nose or some drainage.  This is from the oxygen used during your procedure.  There is no need for concern and it should clear up in a day or so.  SYMPTOMS TO REPORT IMMEDIATELY:   Following lower endoscopy (colonoscopy or flexible sigmoidoscopy):  Excessive amounts of blood in the stool  Significant tenderness or worsening of abdominal pains  Swelling of the abdomen that is new, acute  Fever of 100F or higher   Following upper endoscopy (EGD)  Vomiting of blood or coffee ground material  New chest pain or pain under the shoulder blades  Painful or persistently difficult  swallowing  New shortness of breath  Fever of 100F or higher  Black, tarry-looking stools  For urgent or emergent issues, a gastroenterologist can be reached at any hour by calling [phone].   DIET:  We do recommend a small meal at first, but then you may proceed to your regular diet.  Drink plenty of fluids but you should avoid alcoholic beverages for 24 hours.  MEDICATIONS: Continue present medications.  ACTIVITY:  You should plan to take it easy for the rest of today and you should NOT DRIVE or use heavy machinery until tomorrow (because of the sedation medicines used during the test).    FOLLOW UP: Our staff will call the number listed on your records the next business day following your procedure to check on you and address any questions or concerns that you may have regarding the information given to you following your procedure. If we do not reach you, we will leave a message.  However, if you are feeling well and you are not experiencing any problems, there is no need to return our call.  We will assume that you have returned to your regular daily activities without incident.  If any biopsies were taken you will be contacted by phone or by letter within the next 1-3 weeks.  Please call us at [phone] if you have not heard about the biopsies in 3 weeks.   Thank you for allowing [place] to provide for your healthcare  needs today.  SIGNATURES/CONFIDENTIALITY: You and/or your care partner have signed paperwork which will be entered into your electronic medical record.  These signatures attest to the fact that that the information above on your After Visit Summary has been reviewed and is understood.  Full responsibility of the confidentiality of this discharge information lies with you and/or your care-partner.YOU HAD AN ENDOSCOPIC PROCEDURE TODAY AT [facility]:   Refer to the procedure report that was given to you for any specific questions about what was found  during the examination.  If the procedure report does not answer your questions, please call your gastroenterologist to clarify.  If you requested that your care partner not be given the details of your procedure findings, then the procedure report has been included in a sealed envelope for you to review at your convenience later.  YOU SHOULD EXPECT: Some feelings of bloating in the abdomen. Passage of more gas than usual.  Walking can help get rid of the air that was put into your GI tract during the procedure and reduce the bloating. If you had a lower endoscopy (such as a colonoscopy or flexible sigmoidoscopy) you may notice spotting of blood in your stool or on the toilet paper. If you underwent a bowel prep for your procedure, you may not have a normal bowel movement for a few days.  Please Note:  You might notice some irritation and congestion in your nose or some drainage.  This is from the oxygen used during your procedure.  There is no need for concern and it should clear up in a day or so.  SYMPTOMS TO REPORT IMMEDIATELY:   Following lower endoscopy (colonoscopy or flexible sigmoidoscopy):  Excessive amounts of blood in the stool  Significant tenderness or worsening of abdominal pains  Swelling of the abdomen that is new, acute  Fever of 100F or higher  For urgent or emergent issues, a gastroenterologist can be reached at any hour by calling [phone].   DIET:  We do recommend a small meal at first, but then you may proceed to your regular diet.  Drink plenty of fluids but you should avoid alcoholic beverages for 24 hours.  MEDICATIONS: Continue present medications.  Please see handouts given to you by your recovery nurse.  ACTIVITY:  You should plan to take it easy for the rest of today and you should NOT DRIVE or use heavy machinery until tomorrow (because of the sedation medicines used during the test).    FOLLOW UP: Our staff will call the number listed on your records  the next business day following your procedure to check on you and address any questions or concerns that you may have regarding the information given to you following your procedure. If we do not reach you, we will leave a message.  However, if you are feeling well and you are not experiencing any problems, there is no need to return our call.  We will assume that you have returned to your regular daily activities without incident.  If any biopsies were taken you will be contacted by phone or by letter within the next 1-3 weeks.  Please call us at [phone] if you have not heard about the biopsies in 3 weeks.   Thank you for allowing [place] to provide for your healthcare needs today.  SIGNATURES/CONFIDENTIALITY: You and/or your care partner have signed paperwork which will be entered into your electronic medical record.  These signatures attest to the fact that  that the information above on your After Visit Summary has been reviewed and is understood.  Full responsibility of the confidentiality of this discharge information lies with you and/or your care-partner.

## 2018-05-13 NOTE — Op Note (Signed)
[facility] Patient Name: Gabriela Green Procedure Date: 05/13/2018 2:24 PM MRN: [id number] Endoscopist: Gatha Mayer , MD Age: 67 Referring MD:  Date of Birth: [birth year] Gender: Female Account #: [id number] Procedure:                Colonoscopy Indications:              Screening for colorectal malignant neoplasm, Last                            colonoscopy: 2009 Medicines:                Propofol per Anesthesia, Monitored Anesthesia Care Procedure:                Pre-Anesthesia Assessment:                           - Prior to the procedure, a History and Physical                            was performed, and patient medications and                            allergies were reviewed. The patient's tolerance of                            previous anesthesia was also reviewed. The risks                            and benefits of the procedure and the sedation                            options and risks were discussed with the patient.                            All questions were answered, and informed consent                            was obtained. Prior Anticoagulants: The patient has                            taken no previous anticoagulant or antiplatelet                            agents. ASA Grade Assessment: I - A normal, healthy                            patient. After reviewing the risks and benefits,                            the patient was deemed in satisfactory condition to                            undergo the procedure.  After obtaining informed consent, the colonoscope                            was passed under direct vision. Throughout the                            procedure, the patient's blood pressure, pulse, and                            oxygen saturations were monitored continuously. The                            Colonoscope was introduced through the anus and                            advanced to the the cecum,  identified by                            appendiceal orifice and ileocecal valve. The                            colonoscopy was performed without difficulty. The                            patient tolerated the procedure well. The quality                            of the bowel preparation was good. The ileocecal                            valve, appendiceal orifice, and rectum were                            photographed. The bowel preparation used was                            Miralax. Scope In: 2:32:46 PM Scope Out: 6:81:15 PM Scope Withdrawal Time: 0 hours 8 minutes 20 seconds  Total Procedure Duration: 0 hours 13 minutes 28 seconds  Findings:                 The perianal and digital rectal examinations were                            normal.                           Multiple diverticula were found in the sigmoid                            colon.                           The exam was otherwise without abnormality on  direct and retroflexion views. Complications:            No immediate complications. Estimated Blood Loss:     Estimated blood loss: none. Impression:               - Moderate diverticulosis in the sigmoid colon.                           - The examination was otherwise normal on direct                            and retroflexion views.                           - No specimens collected. Recommendation:           - Patient has a contact number available for                            emergencies. The signs and symptoms of potential                            delayed complications were discussed with the                            patient. Return to normal activities tomorrow.                            Written discharge instructions were provided to the                            patient.                           - Resume previous diet.                           - Continue present medications.                           - Repeat  colonoscopy in 10 years for screening                            purposes. Gatha Mayer, MD 05/13/2018 2:56:07 PM This report has been signed electronically.

## 2018-05-13 NOTE — Progress Notes (Signed)
Pt's states no medical or surgical changes since previsit or office visit. 

## 2018-05-13 NOTE — Progress Notes (Signed)
Report given to PACU, vss 

## 2018-05-14 ENCOUNTER — Telehealth: Payer: Self-pay | Admitting: *Deleted

## 2018-05-14 NOTE — Telephone Encounter (Signed)
  Follow up Call-  Call back number 05/13/2018  Post procedure Call Back phone  # [phone]  Permission to leave phone message Yes  Some recent data might be hidden     Patient questions:  Do you have a fever, pain , or abdominal swelling? No. Pain Score  0 *  Have you tolerated food without any problems? Yes.    Have you been able to return to your normal activities? Yes.    Do you have any questions about your discharge instructions: Diet   No. Medications  No. Follow up visit  No.  Do you have questions or concerns about your Care? No.  Actions: * If pain score is 4 or above: No action needed, pain <4.

## 2018-05-27 ENCOUNTER — Telehealth: Payer: Self-pay

## 2018-05-27 NOTE — Telephone Encounter (Signed)
Written signed and mailed.

## 2018-05-27 NOTE — Telephone Encounter (Signed)
Please write order for shingrix vaccine and I will sign

## 2018-05-27 NOTE — Telephone Encounter (Signed)
I called patient to book her CPE in December, she said she would like a prescription for the singles vaccine mailed. Thanks.

## 2018-06-07 DIAGNOSIS — L738 Other specified follicular disorders: Secondary | ICD-10-CM | POA: Diagnosis not present

## 2018-06-07 DIAGNOSIS — D225 Melanocytic nevi of trunk: Secondary | ICD-10-CM | POA: Diagnosis not present

## 2018-06-07 DIAGNOSIS — L821 Other seborrheic keratosis: Secondary | ICD-10-CM | POA: Diagnosis not present

## 2018-06-07 DIAGNOSIS — L218 Other seborrheic dermatitis: Secondary | ICD-10-CM | POA: Diagnosis not present

## 2018-06-25 ENCOUNTER — Ambulatory Visit (INDEPENDENT_AMBULATORY_CARE_PROVIDER_SITE_OTHER): Payer: PPO | Admitting: Internal Medicine

## 2018-06-25 ENCOUNTER — Encounter: Payer: Self-pay | Admitting: Internal Medicine

## 2018-06-25 VITALS — BP 110/80 | HR 76 | Temp 98.2°F | Ht 67.0 in | Wt 141.0 lb

## 2018-06-25 DIAGNOSIS — M549 Dorsalgia, unspecified: Secondary | ICD-10-CM

## 2018-06-25 DIAGNOSIS — B373 Candidiasis of vulva and vagina: Secondary | ICD-10-CM

## 2018-06-25 DIAGNOSIS — B3731 Acute candidiasis of vulva and vagina: Secondary | ICD-10-CM

## 2018-06-25 MED ORDER — FLUCONAZOLE 150 MG PO TABS: 150.0000 mg | ORAL_TABLET | Freq: Once | ORAL | 1 refills | Status: AC

## 2018-07-04 NOTE — Progress Notes (Signed)
   Subjective:    Patient ID: Fredderick Phenix, female    DOB: May 10, 1951, 67 y.o.   MRN: [id number]  HPI Patient recently flew to [place] for work related activity.  It was a long flight.  While out there she developed some low back pain that was very excruciating.  Does not recall any activity that would have caused the back pain other than a long flight.  Also having some issues with recurrent Candida vaginitis symptoms after sexual intercourse.  I have refilled her Diflucan and she may want to speak with her GYN physician about this.  She has a remote history of lumbar disc and required surgery for this many years ago.    Review of Systems no radiation into legs just low back pain along the left posterior superior iliac spine     Objective:   Physical Exam Straight leg raising is negative at 90 degrees bilaterally and muscle strength is normal in both lower extremities.  Deep tendon reflexes 2+ and symmetrical in the knees.  She is much better today than she was a couple of days ago.       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  Continue taking over-the-counter anti-inflammatory if needed for back pain.

## 2018-07-04 NOTE — Patient Instructions (Signed)
Back pain is improving.  Discussed recurrent Candida vaginitis symptoms with GYN.  Diflucan refilled.

## 2018-07-21 ENCOUNTER — Ambulatory Visit (INDEPENDENT_AMBULATORY_CARE_PROVIDER_SITE_OTHER): Payer: PPO | Admitting: Internal Medicine

## 2018-07-21 ENCOUNTER — Telehealth: Payer: Self-pay

## 2018-07-21 ENCOUNTER — Encounter: Payer: Self-pay | Admitting: Internal Medicine

## 2018-07-21 VITALS — BP 102/70 | HR 72 | Temp 98.1°F | Ht 67.0 in | Wt 141.0 lb

## 2018-07-21 DIAGNOSIS — S50811A Abrasion of right forearm, initial encounter: Secondary | ICD-10-CM | POA: Diagnosis not present

## 2018-07-21 DIAGNOSIS — L089 Local infection of the skin and subcutaneous tissue, unspecified: Secondary | ICD-10-CM | POA: Diagnosis not present

## 2018-07-21 DIAGNOSIS — S41151A Open bite of right upper arm, initial encounter: Secondary | ICD-10-CM

## 2018-07-21 DIAGNOSIS — W540XXA Bitten by dog, initial encounter: Secondary | ICD-10-CM | POA: Diagnosis not present

## 2018-07-21 MED ORDER — DOXYCYCLINE HYCLATE 100 MG PO TABS: 100.0000 mg | ORAL_TABLET | Freq: Two times a day (BID) | ORAL | 0 refills | Status: DC

## 2018-07-21 MED ORDER — MUPIROCIN 2 % EX OINT: TOPICAL_OINTMENT | CUTANEOUS | 0 refills | Status: DC

## 2018-07-21 MED ORDER — FLUCONAZOLE 150 MG PO TABS: 150.0000 mg | ORAL_TABLET | Freq: Once | ORAL | 1 refills | Status: AC

## 2018-07-21 NOTE — Progress Notes (Signed)
   Subjective:    Patient ID: Gabriela Green, female    DOB: Nov 19, 1950, 67 y.o.   MRN: [id number]  HPI Patient suffered dog bite right arm about 4 days ago.  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 Pt's tetanus immuniation is up to date.    Review of Systems no fever or chills.  No drainage from bite site.     Objective:   Physical Exam  Superficial  69mm abarsion right forearm with surrounding erythema. Afebrile. VS reviewed.      Assessment & Plan:  Abrasion right forearm from dog bite with surrounding cellulitis  Plan: Bactroban bid to bite after cleaning with warm soapy water and applying peroxide, doxycycline twice daily for 10 days.  Diflucan if needed for Candida vaginitis on antibiotics.  Keep wrapped until healed.  Check with dog owner to see if rabies immunization is up-to-date.  Her tetanus immunization is up-to-date.

## 2018-07-21 NOTE — Telephone Encounter (Signed)
Patient called states she got bit by a dog a week ago on her arm she said the area where the tooth entered is very red and swollen and wants to know if she needs to come in for some antibiotics or if there is a cream that you can recommend that she uses.

## 2018-07-21 NOTE — Patient Instructions (Signed)
Pt to check with dog owners to see if Rabies vaccine is up to date. Doxycycline 100 mg twice a day x 10 days. Diflucan 100 mg twice a day x 10 days. Bactroban ointment to arm twice a day until healed.

## 2018-07-21 NOTE — Telephone Encounter (Signed)
She will need to come before 12 or go to urgent care

## 2018-07-21 NOTE — Telephone Encounter (Signed)
Scheduled

## 2018-08-09 ENCOUNTER — Other Ambulatory Visit: Payer: Self-pay | Admitting: Internal Medicine

## 2018-08-09 DIAGNOSIS — Z0000 Encounter for general adult medical examination without abnormal findings: Secondary | ICD-10-CM

## 2018-08-09 DIAGNOSIS — M8580 Other specified disorders of bone density and structure, unspecified site: Secondary | ICD-10-CM

## 2018-08-09 DIAGNOSIS — K580 Irritable bowel syndrome with diarrhea: Secondary | ICD-10-CM

## 2018-08-12 ENCOUNTER — Other Ambulatory Visit: Payer: PPO | Admitting: Internal Medicine

## 2018-08-12 DIAGNOSIS — K580 Irritable bowel syndrome with diarrhea: Secondary | ICD-10-CM

## 2018-08-12 DIAGNOSIS — Z0000 Encounter for general adult medical examination without abnormal findings: Secondary | ICD-10-CM

## 2018-08-12 DIAGNOSIS — M8580 Other specified disorders of bone density and structure, unspecified site: Secondary | ICD-10-CM

## 2018-08-13 LAB — COMPLETE METABOLIC PANEL WITH GFR
AG Ratio: 1.5 (calc) (ref 1.0–2.5)
ALKALINE PHOSPHATASE (APISO): 38 U/L (ref 33–130)
ALT: 13 U/L (ref 6–29)
AST: 15 U/L (ref 10–35)
Albumin: 4.4 g/dL (ref 3.6–5.1)
BUN: 10 mg/dL (ref 7–25)
CO2: 31 mmol/L (ref 20–32)
Calcium: 9.8 mg/dL (ref 8.6–10.4)
Chloride: 102 mmol/L (ref 98–110)
Creat: 0.65 mg/dL (ref 0.50–0.99)
GFR, Est African American: 108 mL/min/{1.73_m2} (ref >=60)
GFR, Est Non African American: 93 mL/min/{1.73_m2} (ref >=60)
Globulin: 2.9 g/dL (calc) (ref 1.9–3.7)
Glucose, Bld: 86 mg/dL (ref 65–99)
Potassium: 4.7 mmol/L (ref 3.5–5.3)
Sodium: 137 mmol/L (ref 135–146)
Total Bilirubin: 0.7 mg/dL (ref 0.2–1.2)
Total Protein: 7.3 g/dL (ref 6.1–8.1)

## 2018-08-13 LAB — CBC WITH DIFFERENTIAL/PLATELET
BASOS ABS: 72 {cells}/uL (ref 0–200)
Basophils Relative: 1.5 %
Eosinophils Absolute: 130 cells/uL (ref 15–500)
Eosinophils Relative: 2.7 %
HCT: 41.9 % (ref 35.0–45.0)
HEMOGLOBIN: 14.3 g/dL (ref 11.7–15.5)
Lymphs Abs: 1248 cells/uL (ref 850–3900)
MCH: 31.5 pg (ref 27.0–33.0)
MCHC: 34.1 g/dL (ref 32.0–36.0)
MCV: 92.3 fL (ref 80.0–100.0)
MONOS PCT: 10.6 %
MPV: 10 fL (ref 7.5–12.5)
Neutro Abs: 2842 cells/uL (ref 1500–7800)
Neutrophils Relative %: 59.2 %
Platelets: 392 10*3/uL (ref 140–400)
RBC: 4.54 10*6/uL (ref 3.80–5.10)
RDW: 11.8 % (ref 11.0–15.0)
Total Lymphocyte: 26 %
WBC mixed population: 509 cells/uL (ref 200–950)
WBC: 4.8 10*3/uL (ref 3.8–10.8)

## 2018-08-13 LAB — TSH: TSH: 2.16 mIU/L (ref 0.40–4.50)

## 2018-08-13 LAB — LIPID PANEL
Cholesterol: 208 mg/dL — ABNORMAL HIGH (ref <200)
HDL: 91 mg/dL (ref >50)
LDL CHOLESTEROL (CALC): 100 mg/dL — AB
Non-HDL Cholesterol (Calc): 117 mg/dL (calc) (ref <130)
Total CHOL/HDL Ratio: 2.3 (calc) (ref <5.0)
Triglycerides: 82 mg/dL (ref <150)

## 2018-08-16 ENCOUNTER — Encounter: Payer: PPO | Admitting: Internal Medicine

## 2018-08-17 ENCOUNTER — Ambulatory Visit (INDEPENDENT_AMBULATORY_CARE_PROVIDER_SITE_OTHER): Payer: PPO | Admitting: Internal Medicine

## 2018-08-17 ENCOUNTER — Encounter: Payer: Self-pay | Admitting: Internal Medicine

## 2018-08-17 VITALS — BP 100/60 | HR 76 | Ht 67.0 in | Wt 140.0 lb

## 2018-08-17 DIAGNOSIS — K580 Irritable bowel syndrome with diarrhea: Secondary | ICD-10-CM

## 2018-08-17 DIAGNOSIS — E2839 Other primary ovarian failure: Secondary | ICD-10-CM

## 2018-08-17 DIAGNOSIS — Z0000 Encounter for general adult medical examination without abnormal findings: Secondary | ICD-10-CM

## 2018-08-17 DIAGNOSIS — Z23 Encounter for immunization: Secondary | ICD-10-CM | POA: Diagnosis not present

## 2018-08-17 DIAGNOSIS — M8580 Other specified disorders of bone density and structure, unspecified site: Secondary | ICD-10-CM | POA: Diagnosis not present

## 2018-08-17 LAB — POCT URINALYSIS DIPSTICK
Appearance: NORMAL
Bilirubin, UA: NEGATIVE
Blood, UA: NEGATIVE
Glucose, UA: NEGATIVE
Ketones, UA: NEGATIVE
Leukocytes, UA: NEGATIVE
Nitrite, UA: NEGATIVE
Odor: NORMAL
Protein, UA: NEGATIVE
SPEC GRAV UA: 1.01 (ref 1.010–1.025)
Urobilinogen, UA: 0.2 E.U./dL
pH, UA: 6.5 (ref 5.0–8.0)

## 2018-08-17 MED ORDER — HYOSCYAMINE SULFATE 0.125 MG SL SUBL: SUBLINGUAL_TABLET | SUBLINGUAL | 5 refills | Status: DC

## 2018-08-17 MED ORDER — VALACYCLOVIR HCL 500 MG PO TABS: 500.0000 mg | ORAL_TABLET | Freq: Two times a day (BID) | ORAL | 11 refills | Status: DC

## 2018-08-17 NOTE — Progress Notes (Signed)
Subjective:    Patient ID: Gabriela Green, female    DOB: 1951-08-29, 67 y.o.   MRN: [id number]  HPI   67 year old Female for health maintenance exam, Welcome to Medicare visit, and evaluation of medical issues.  General health is excellent.  She has a history of osteopenia.  Vitamin D supplement recommended.  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  History of allergic rhinitis.  Had breast implants 1998.  Hysterectomy BSO August 2005.  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  History of tinnitus which is felt to be benign.  Non-smoker.  Social alcohol consumption.  Colonoscopy by Dr. Sharlett Iles in 2009 with 10-year follow-up due  Social history: Divorced with 2 adult daughters.  Has several grandchildren.  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  Family history: Father died with pneumonia.  2 brothers and 2 sisters.  One sister with diabetes.    Review of Systems  Constitutional: Negative.   Respiratory: Negative.   Cardiovascular: Negative.        Breast without masses  Gastrointestinal:       Some irritable bowel type issues with travel at times  Genitourinary: Negative.   Neurological: Negative.   Psychiatric/Behavioral: Negative.        Objective:   Physical Exam Constitutional:      General: She is not in acute distress.    Appearance: Normal appearance.  HENT:     Head: Normocephalic and atraumatic.     Right Ear: Tympanic membrane, ear canal and external ear normal.     Left Ear: Tympanic membrane, ear canal and external ear normal.     Nose: Nose normal.     Mouth/Throat:     Mouth: Mucous membranes are moist.     Pharynx: Oropharynx is clear.  Eyes:     General:        Right eye: No discharge.        Left eye: No discharge.     Extraocular Movements: Extraocular movements intact.     Pupils: Pupils are equal, round, and reactive to light.  Neck:     Musculoskeletal: Neck supple.     Vascular: No  carotid bruit.  Cardiovascular:     Rate and Rhythm: Normal rate and regular rhythm.     Pulses: Normal pulses.     Heart sounds: No murmur.  Pulmonary:     Effort: Pulmonary effort is normal. No respiratory distress.     Breath sounds: Normal breath sounds. No wheezing, rhonchi or rales.  Abdominal:     General: There is no distension.     Palpations: Abdomen is soft. There is no mass.     Tenderness: There is no abdominal tenderness. There is no guarding or rebound.     Comments: No organomegaly  Genitourinary:    Comments: Deferred due to TAH/BSO Musculoskeletal:     Right lower leg: No edema.     Left lower leg: No edema.  Lymphadenopathy:     Cervical: No cervical adenopathy.  Skin:    General: Skin is warm and dry.  Neurological:     General: No focal deficit present.     Mental Status: She is alert and oriented to person, place, and time.     Cranial Nerves: No cranial nerve deficit.     Sensory: No sensory deficit.     Motor: No weakness.     Coordination: Coordination normal.  Gait: Gait normal.  Psychiatric:        Mood and Affect: Mood normal.        Behavior: Behavior normal.        Thought Content: Thought content normal.        Judgment: Judgment normal.           Assessment & Plan:  History of tinnitus thought to be benign  History of irritable bowel syndrome for which she takes as needed Levsin  History of osteopenia-continue vitamin D supplement.  To have bone density study every 2 years order placed  History of Herpes simplex type II with very rare outbreaks treated with Valtrex  Routine health maintenance-flu vaccine given.  Welcome to Medicare EKG is within normal limits  Return in 1 year or as needed.  Subjective:   Patient presents for Medicare Annual/Subsequent preventive examination.  Review Past Medical/Family/Social: See above   Risk Factors  Current exercise habits: Exercises regularly and watches weight Dietary issues  discussed: Low-fat low carbohydrate  Cardiac risk factors: none  Depression Screen  (Note: if answer to either of the following is "Yes", a more complete depression screening is indicated)   Over the past two weeks, have you felt down, depressed or hopeless? No  Over the past two weeks, have you felt little interest or pleasure in doing things? No Have you lost interest or pleasure in daily life? No Do you often feel hopeless? No Do you cry easily over simple problems? No   Activities of Daily Living  In your present state of health, do you have any difficulty performing the following activities?:   Driving? No  Managing money? No  Feeding yourself? No  Getting from bed to chair? No  Climbing a flight of stairs? No  Preparing food and eating?: No  Bathing or showering? No  Getting dressed: No  Getting to the toilet? No  Using the toilet:No  Moving around from place to place: No  In the past year have you fallen or had a near fall?:No  Are you sexually active? yes Do you have more than one partner? No   Hearing Difficulties: No  Do you often ask people to speak up or repeat themselves? No  Do you experience ringing or noises in your ears? No  Do you have difficulty understanding soft or whispered voices? No  Do you feel that you have a problem with memory?  Occasionally Do you often misplace items?  Occasionally   Home Safety:  Do you have a smoke alarm at your residence? Yes Do you have grab bars in the bathroom?  No Do you have throw rugs in your house?  Yes   Cognitive Testing  Alert? Yes Normal Appearance?Yes  Oriented to person? Yes Place? Yes  Time? Yes  Recall of three objects? Yes  Can perform simple calculations? Yes  Displays appropriate judgment?Yes  Can read the correct time from a watch face?Yes   List the Names of Other Physician/Practitioners you currently use:  See referral list for the physicians patient is currently seeing.  On file  Review  of Systems: See above   Objective:     General appearance: Appears younger than stated age Head: Normocephalic, without obvious abnormality, atraumatic  Eyes: conj clear, EOMi PEERLA  Ears: normal TM's and external ear canals both ears  Nose: Nares normal. Septum midline. Mucosa normal. No drainage or sinus tenderness.  Throat: lips, mucosa, and tongue normal; teeth and gums normal  Neck: no  adenopathy, no carotid bruit, no JVD, supple, symmetrical, trachea midline and thyroid not enlarged, symmetric, no tenderness/mass/nodules  No CVA tenderness.  Lungs: clear to auscultation bilaterally  Breasts: normal appearance, no masses or tenderness Heart: regular rate and rhythm, S1, S2 normal, no murmur, click, rub or gallop  Abdomen: soft, non-tender; bowel sounds normal; no masses, no organomegaly  Musculoskeletal: ROM normal in all joints, no crepitus, no deformity, Normal muscle strengthen. Back  is symmetric, no curvature. Skin: Skin color, texture, turgor normal. No rashes or lesions  Lymph nodes: Cervical, supraclavicular, and axillary nodes normal.  Neurologic: CN 2 -12 Normal, Normal symmetric reflexes. Normal coordination and gait  Psych: Alert & Oriented x 3, Mood appear stable.    Assessment:    Annual wellness medicare exam   Plan:    During the course of the visit the patient was educated and counseled about appropriate screening and preventive services including:   Annual flu vaccine  Prevnar 13     Patient Instructions (the written plan) was given to the patient.  Medicare Attestation  I have personally reviewed:  The patient's medical and social history  Their use of alcohol, tobacco or illicit drugs  Their current medications and supplements  The patient's functional ability including ADLs,fall risks, home safety risks, cognitive, and hearing and visual impairment  Diet and physical activities  Evidence for depression or mood disorders  The patient's  weight, height, BMI, and visual acuity have been recorded in the chart. I have made referrals, counseling, and provided education to the patient based on review of the above and I have provided the patient with a written personalized care plan for preventive services.

## 2018-08-23 ENCOUNTER — Ambulatory Visit (INDEPENDENT_AMBULATORY_CARE_PROVIDER_SITE_OTHER): Payer: PPO | Admitting: Internal Medicine

## 2018-08-23 VITALS — BP 116/78 | HR 67 | Temp 97.3°F | Resp 18 | Wt 139.0 lb

## 2018-08-23 DIAGNOSIS — J22 Unspecified acute lower respiratory infection: Secondary | ICD-10-CM

## 2018-08-23 MED ORDER — FLUCONAZOLE 150 MG PO TABS: 150.0000 mg | ORAL_TABLET | Freq: Once | ORAL | 1 refills | Status: AC

## 2018-08-23 MED ORDER — AZITHROMYCIN 250 MG PO TABS: ORAL_TABLET | ORAL | 0 refills | Status: DC

## 2018-08-23 MED ORDER — HYDROCODONE-HOMATROPINE 5-1.5 MG/5ML PO SYRP: 5.0000 mL | ORAL_SOLUTION | Freq: Three times a day (TID) | ORAL | 0 refills | Status: DC | PRN

## 2018-08-23 MED ORDER — BENZONATATE 100 MG PO CAPS: 100.0000 mg | ORAL_CAPSULE | Freq: Three times a day (TID) | ORAL | 0 refills | Status: DC | PRN

## 2018-08-23 NOTE — Progress Notes (Signed)
   Subjective:    Patient ID: Gabriela Green, female    DOB: November 28, 1950, 67 y.o.   MRN: [id number]  HPI Has come down with respiratory infection with cough and congestion.  Some discolored sputum production.  No frank sore throat.  No fever or shaking chills.  Sounds congested when she speaks.    Review of Systems see above-denies sore throat or ear pain     Objective:   Physical Exam  Skin warm and dry.  Nodes none.  TMs and pharynx are clear.  Neck is supple.  Chest clear to auscultation without rales or wheezing but she sounds nasally congested.      Assessment & Plan:  Acute lower respiratory infection  Plan: Hycodan 1 teaspoon p.o. every 8 hours as needed cough.  Zithromax Z-PAK take 2 tablets day 1 followed by 1 tablet days 2 through 5.  Tessalon Perles 100 mg 3 times a day as needed for cough.  Diflucan if needed for Candida vaginitis while on antibiotics.

## 2018-09-05 ENCOUNTER — Encounter: Payer: Self-pay | Admitting: Internal Medicine

## 2018-09-05 NOTE — Patient Instructions (Signed)
It was a pleasure to see you today.  Take vitamin D supplement.  Have bone density study.  Okay to refill Levsin for irritable bowel syndrome.  Flu vaccine given.  Return in 1 year or as needed.

## 2018-09-05 NOTE — Patient Instructions (Signed)
Take Zithromax Z-PAK as directed.  Hycodan sparingly for cough.  Tessalon Perles for cough during the daytime when working.  Diflucan if needed for Candida vaginitis.  Rest and drink plenty of fluids.

## 2018-09-06 ENCOUNTER — Other Ambulatory Visit: Payer: Self-pay

## 2018-09-06 MED ORDER — FLUCONAZOLE 150 MG PO TABS: 150.0000 mg | ORAL_TABLET | Freq: Once | ORAL | 1 refills | Status: AC

## 2018-09-06 NOTE — Telephone Encounter (Signed)
Patient is calling to request an rx for diflucan.

## 2018-09-14 ENCOUNTER — Encounter: Payer: Self-pay | Admitting: Internal Medicine

## 2018-09-14 ENCOUNTER — Ambulatory Visit (INDEPENDENT_AMBULATORY_CARE_PROVIDER_SITE_OTHER): Payer: PPO | Admitting: Internal Medicine

## 2018-09-14 VITALS — BP 110/70 | HR 60 | Temp 98.1°F

## 2018-09-14 DIAGNOSIS — E2839 Other primary ovarian failure: Secondary | ICD-10-CM | POA: Diagnosis not present

## 2018-09-14 DIAGNOSIS — Z23 Encounter for immunization: Secondary | ICD-10-CM | POA: Diagnosis not present

## 2018-09-14 DIAGNOSIS — E559 Vitamin D deficiency, unspecified: Secondary | ICD-10-CM | POA: Diagnosis not present

## 2018-09-14 NOTE — Patient Instructions (Addendum)
Patient received a prevnar 13 IM, left deltoid. AV, CMA.  She has vitamin D deficiency and will take 50,000 units vitamin D3 weekly for 12 weeks then 2000 units vitamin D3 daily orally over-the-counter.  Will have bone density study in March

## 2018-09-15 LAB — VITAMIN D 25 HYDROXY (VIT D DEFICIENCY, FRACTURES): Vit D, 25-Hydroxy: 24 ng/mL — ABNORMAL LOW (ref 30–100)

## 2018-10-01 NOTE — Progress Notes (Signed)
Prevnar 13 given by CMA and vitamin D level drawn.  Vitamin D level is 24.  She will take 50,000 units Drisdol weekly for 12 weeks then 2000 units vitamin D3 daily orally over-the-counter.

## 2018-11-02 ENCOUNTER — Other Ambulatory Visit: Payer: Self-pay | Admitting: Internal Medicine

## 2018-11-02 DIAGNOSIS — Z1231 Encounter for screening mammogram for malignant neoplasm of breast: Secondary | ICD-10-CM

## 2018-11-08 ENCOUNTER — Other Ambulatory Visit: Payer: Self-pay

## 2018-11-08 MED ORDER — FLUCONAZOLE 150 MG PO TABS: 150.0000 mg | ORAL_TABLET | Freq: Once | ORAL | 3 refills | Status: AC

## 2018-11-10 ENCOUNTER — Other Ambulatory Visit: Payer: PPO

## 2018-11-12 ENCOUNTER — Ambulatory Visit: Payer: PPO | Admitting: Internal Medicine

## 2018-11-15 ENCOUNTER — Ambulatory Visit (INDEPENDENT_AMBULATORY_CARE_PROVIDER_SITE_OTHER): Payer: PPO | Admitting: Internal Medicine

## 2018-11-15 ENCOUNTER — Encounter: Payer: Self-pay | Admitting: Internal Medicine

## 2018-11-15 VITALS — BP 110/80 | HR 74 | Temp 98.3°F | Ht 67.0 in | Wt 139.0 lb

## 2018-11-15 DIAGNOSIS — R350 Frequency of micturition: Secondary | ICD-10-CM | POA: Diagnosis not present

## 2018-11-15 DIAGNOSIS — R300 Dysuria: Secondary | ICD-10-CM | POA: Diagnosis not present

## 2018-11-15 DIAGNOSIS — B3731 Acute candidiasis of vulva and vagina: Secondary | ICD-10-CM

## 2018-11-15 DIAGNOSIS — B373 Candidiasis of vulva and vagina: Secondary | ICD-10-CM | POA: Diagnosis not present

## 2018-11-15 DIAGNOSIS — R109 Unspecified abdominal pain: Secondary | ICD-10-CM

## 2018-11-15 LAB — POCT URINALYSIS DIPSTICK
Appearance: NEGATIVE
Bilirubin, UA: NEGATIVE
Blood, UA: NEGATIVE
Glucose, UA: NEGATIVE
Ketones, UA: NEGATIVE
Leukocytes, UA: NEGATIVE
Nitrite, UA: NEGATIVE
Odor: NEGATIVE
Protein, UA: NEGATIVE
SPEC GRAV UA: 1.015 (ref 1.010–1.025)
Urobilinogen, UA: 0.2 E.U./dL
pH, UA: 6 (ref 5.0–8.0)

## 2018-11-15 MED ORDER — TERCONAZOLE 0.4 % VA CREA: 1.0000 | TOPICAL_CREAM | Freq: Every day | VAGINAL | 3 refills | Status: DC

## 2018-11-15 MED ORDER — HYDROCODONE-ACETAMINOPHEN 5-325 MG PO TABS: 1.0000 | ORAL_TABLET | Freq: Four times a day (QID) | ORAL | 0 refills | Status: DC | PRN

## 2018-11-20 ENCOUNTER — Other Ambulatory Visit: Payer: Self-pay | Admitting: Internal Medicine

## 2018-12-03 ENCOUNTER — Ambulatory Visit (INDEPENDENT_AMBULATORY_CARE_PROVIDER_SITE_OTHER): Payer: PPO | Admitting: Plastic Surgery

## 2018-12-03 ENCOUNTER — Other Ambulatory Visit: Payer: Self-pay

## 2018-12-03 ENCOUNTER — Encounter: Payer: Self-pay | Admitting: Plastic Surgery

## 2018-12-03 VITALS — BP 109/73 | HR 67 | Temp 98.1°F | Ht 67.0 in | Wt 141.0 lb

## 2018-12-03 DIAGNOSIS — T22211A Burn of second degree of right forearm, initial encounter: Secondary | ICD-10-CM
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
## 2018-12-07 NOTE — Progress Notes (Signed)
   Subjective:    Patient ID: Gabriela Green, female    DOB: 1950-10-20, 68 y.o.   MRN: [id number]  HPI Patient having some issues with left flank pain.  Urinalysis today is unremarkable.  She has no hematuria.  No significant urinary symptoms.  Has history of recurrent Candida vaginitis which is frequent and seems to be related to sexual intercourse.  Unfortunately has to travel to [place] tomorrow for a day trip to see a client it is uncomfortable.  Remote history of lumbar disc disease status post laminectomy.    Review of Systems no fever or chills.     Objective:   Physical Exam Dipstick UA is normal.  She has some tenderness in her left paralumbar region below her rib cage.  Lower extremity strength is normal.  Straight leg raising is negative at 90 degrees bilaterally.       Assessment & Plan:  Musculoskeletal pain left flank  Recurrent bouts of Candida vaginitis  Plan: Since she has to travel tomorrow I think it is prudent for her to take with her Norco 5/325 1 tablet p.o. every 6 hours as needed pain #20 no refill  For recurrent bouts of Candida vaginitis try Terazol 7 vaginal cream instead of Diflucan.  She will have renal ultrasound to rule out obstruction/tumor/done.

## 2018-12-07 NOTE — Patient Instructions (Signed)
Terazol 7 vaginal cream for Candida vaginitis recurrent episodes.  Norco 5/325 to take on trip to [place] for pain.  Had renal ultrasound.

## 2018-12-08 ENCOUNTER — Inpatient Hospital Stay: Admission: RE | Admit: 2018-12-08 | Payer: PPO | Source: Ambulatory Visit

## 2018-12-15 ENCOUNTER — Telehealth: Payer: Self-pay | Admitting: Plastic Surgery

## 2018-12-15 NOTE — Telephone Encounter (Signed)
Called patient to confirm appointment scheduled for tomorrow. Patient answered the following questions: °1.Has the patient traveled outside of the state of [place] at all within the past 6 weeks? No °2.Does the patient have a fever or cough at all? No °3.Has the patient been tested for COVID? Had a positive COVID test? No °4. Has the patient been in contact with anyone who has tested positive? No ° °

## 2018-12-16 ENCOUNTER — Other Ambulatory Visit: Payer: Self-pay

## 2018-12-16 ENCOUNTER — Other Ambulatory Visit: Payer: PPO

## 2018-12-16 ENCOUNTER — Ambulatory Visit (INDEPENDENT_AMBULATORY_CARE_PROVIDER_SITE_OTHER): Payer: PPO | Admitting: Plastic Surgery

## 2018-12-16 ENCOUNTER — Encounter: Payer: Self-pay | Admitting: Plastic Surgery

## 2018-12-16 DIAGNOSIS — T22211A Burn of second degree of right forearm, initial encounter: Secondary | ICD-10-CM

## 2018-12-16 DIAGNOSIS — T22219A Burn of second degree of unspecified forearm, initial encounter: Secondary | ICD-10-CM | POA: Insufficient documentation
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
## 2019-01-18 ENCOUNTER — Telehealth: Payer: Self-pay | Admitting: Internal Medicine

## 2019-01-18 NOTE — Telephone Encounter (Signed)
[facility] [phone]  [name] called to say her shoulder is not getting any better, she thinks it might be time to see ortho. She has not seen ortho in 12 or more years. She is going to check with her insurance to see who is in network.

## 2019-01-18 NOTE — Telephone Encounter (Signed)
We have no documentation recently of a shoulder problem.

## 2019-01-20 ENCOUNTER — Encounter: Payer: Self-pay | Admitting: Internal Medicine

## 2019-01-20 ENCOUNTER — Ambulatory Visit: Payer: PPO

## 2019-01-20 ENCOUNTER — Ambulatory Visit (INDEPENDENT_AMBULATORY_CARE_PROVIDER_SITE_OTHER): Payer: PPO | Admitting: Internal Medicine

## 2019-01-20 ENCOUNTER — Other Ambulatory Visit: Payer: PPO

## 2019-01-20 DIAGNOSIS — M19011 Primary osteoarthritis, right shoulder: Secondary | ICD-10-CM

## 2019-01-20 NOTE — Telephone Encounter (Signed)
OK to schedule

## 2019-01-20 NOTE — Patient Instructions (Signed)
Patient will be seen by orthopedist in the near future to evaluate right shoulder pain.

## 2019-01-20 NOTE — Telephone Encounter (Signed)
Is virtual visit ok to discuss shoulder pain?
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
## 2019-01-20 NOTE — Telephone Encounter (Signed)
Virtual visit scheduled.  

## 2019-01-24 DIAGNOSIS — M25511 Pain in right shoulder: Secondary | ICD-10-CM | POA: Diagnosis not present

## 2019-01-24 DIAGNOSIS — M7501 Adhesive capsulitis of right shoulder: Secondary | ICD-10-CM | POA: Diagnosis not present

## 2019-01-30 DIAGNOSIS — Z03818 Encounter for observation for suspected exposure to other biological agents ruled out: Secondary | ICD-10-CM | POA: Diagnosis not present

## 2019-02-02 DIAGNOSIS — M7501 Adhesive capsulitis of right shoulder: Secondary | ICD-10-CM | POA: Diagnosis not present

## 2019-02-02 DIAGNOSIS — M25511 Pain in right shoulder: Secondary | ICD-10-CM | POA: Diagnosis not present

## 2019-03-01 ENCOUNTER — Ambulatory Visit
Admission: RE | Admit: 2019-03-01 | Discharge: 2019-03-01 | Disposition: A | Discharge status: Home / Self Care | Payer: PPO | Source: Ambulatory Visit | Attending: Internal Medicine | Admitting: Internal Medicine

## 2019-03-01 DIAGNOSIS — E2839 Other primary ovarian failure: Secondary | ICD-10-CM

## 2019-03-01 DIAGNOSIS — Z13820 Encounter for screening for osteoporosis: Secondary | ICD-10-CM | POA: Diagnosis not present

## 2019-03-01 DIAGNOSIS — Z1231 Encounter for screening mammogram for malignant neoplasm of breast: Secondary | ICD-10-CM | POA: Diagnosis not present

## 2019-03-08 DIAGNOSIS — M7501 Adhesive capsulitis of right shoulder: Secondary | ICD-10-CM | POA: Insufficient documentation

## 2019-03-28 DIAGNOSIS — M7501 Adhesive capsulitis of right shoulder: Secondary | ICD-10-CM | POA: Diagnosis not present

## 2019-03-28 DIAGNOSIS — M25511 Pain in right shoulder: Secondary | ICD-10-CM | POA: Diagnosis not present

## 2019-04-18 ENCOUNTER — Telehealth: Payer: Self-pay | Admitting: Internal Medicine

## 2019-04-18 NOTE — Telephone Encounter (Signed)
LVM to CB and schedule CPE for after 08/18/19

## 2019-05-03 NOTE — Telephone Encounter (Signed)
PT CB and scheduled appointments

## 2019-05-05 DIAGNOSIS — H5203 Hypermetropia, bilateral: Secondary | ICD-10-CM | POA: Diagnosis not present

## 2019-05-05 DIAGNOSIS — H524 Presbyopia: Secondary | ICD-10-CM | POA: Diagnosis not present

## 2019-05-05 DIAGNOSIS — H52222 Regular astigmatism, left eye: Secondary | ICD-10-CM | POA: Diagnosis not present

## 2019-05-10 DIAGNOSIS — N9489 Other specified conditions associated with female genital organs and menstrual cycle: Secondary | ICD-10-CM | POA: Diagnosis not present

## 2019-05-10 DIAGNOSIS — Z01419 Encounter for gynecological examination (general) (routine) without abnormal findings: Secondary | ICD-10-CM | POA: Diagnosis not present

## 2019-05-10 DIAGNOSIS — N951 Menopausal and female climacteric states: Secondary | ICD-10-CM | POA: Diagnosis not present

## 2019-05-10 DIAGNOSIS — Z6822 Body mass index (BMI) 22.0-22.9, adult: Secondary | ICD-10-CM | POA: Diagnosis not present

## 2019-05-24 ENCOUNTER — Ambulatory Visit: Payer: PPO | Admitting: Internal Medicine

## 2019-05-25 DIAGNOSIS — D2239 Melanocytic nevi of other parts of face: Secondary | ICD-10-CM | POA: Diagnosis not present

## 2019-05-25 DIAGNOSIS — M720 Palmar fascial fibromatosis [Dupuytren]: Secondary | ICD-10-CM | POA: Diagnosis not present

## 2019-05-25 DIAGNOSIS — L218 Other seborrheic dermatitis: Secondary | ICD-10-CM | POA: Diagnosis not present

## 2019-05-25 DIAGNOSIS — L309 Dermatitis, unspecified: Secondary | ICD-10-CM | POA: Diagnosis not present

## 2019-05-26 ENCOUNTER — Ambulatory Visit
Admission: RE | Admit: 2019-05-26 | Discharge: 2019-05-26 | Disposition: A | Discharge status: Home / Self Care | Payer: PPO | Source: Ambulatory Visit | Attending: Internal Medicine | Admitting: Internal Medicine

## 2019-05-26 DIAGNOSIS — R109 Unspecified abdominal pain: Secondary | ICD-10-CM | POA: Diagnosis not present

## 2019-05-31 ENCOUNTER — Other Ambulatory Visit: Payer: Self-pay

## 2019-05-31 ENCOUNTER — Ambulatory Visit (INDEPENDENT_AMBULATORY_CARE_PROVIDER_SITE_OTHER): Payer: PPO | Admitting: Internal Medicine

## 2019-05-31 ENCOUNTER — Encounter: Payer: Self-pay | Admitting: Internal Medicine

## 2019-05-31 DIAGNOSIS — Z23 Encounter for immunization: Secondary | ICD-10-CM

## 2019-05-31 NOTE — Patient Instructions (Signed)
Patient received a flu vaccine IM L deltoid, AV, CMA  

## 2019-05-31 NOTE — Progress Notes (Signed)
Flu vaccine given by CMA 

## 2019-06-04 IMAGING — CR DG CHEST 2V
3 series · 3 of 3 positions shown · non-contrast
Comparison: September 09, 2013

CLINICAL DATA: Chest tightness

EXAM:
CHEST  2 VIEW

[w chest pa]
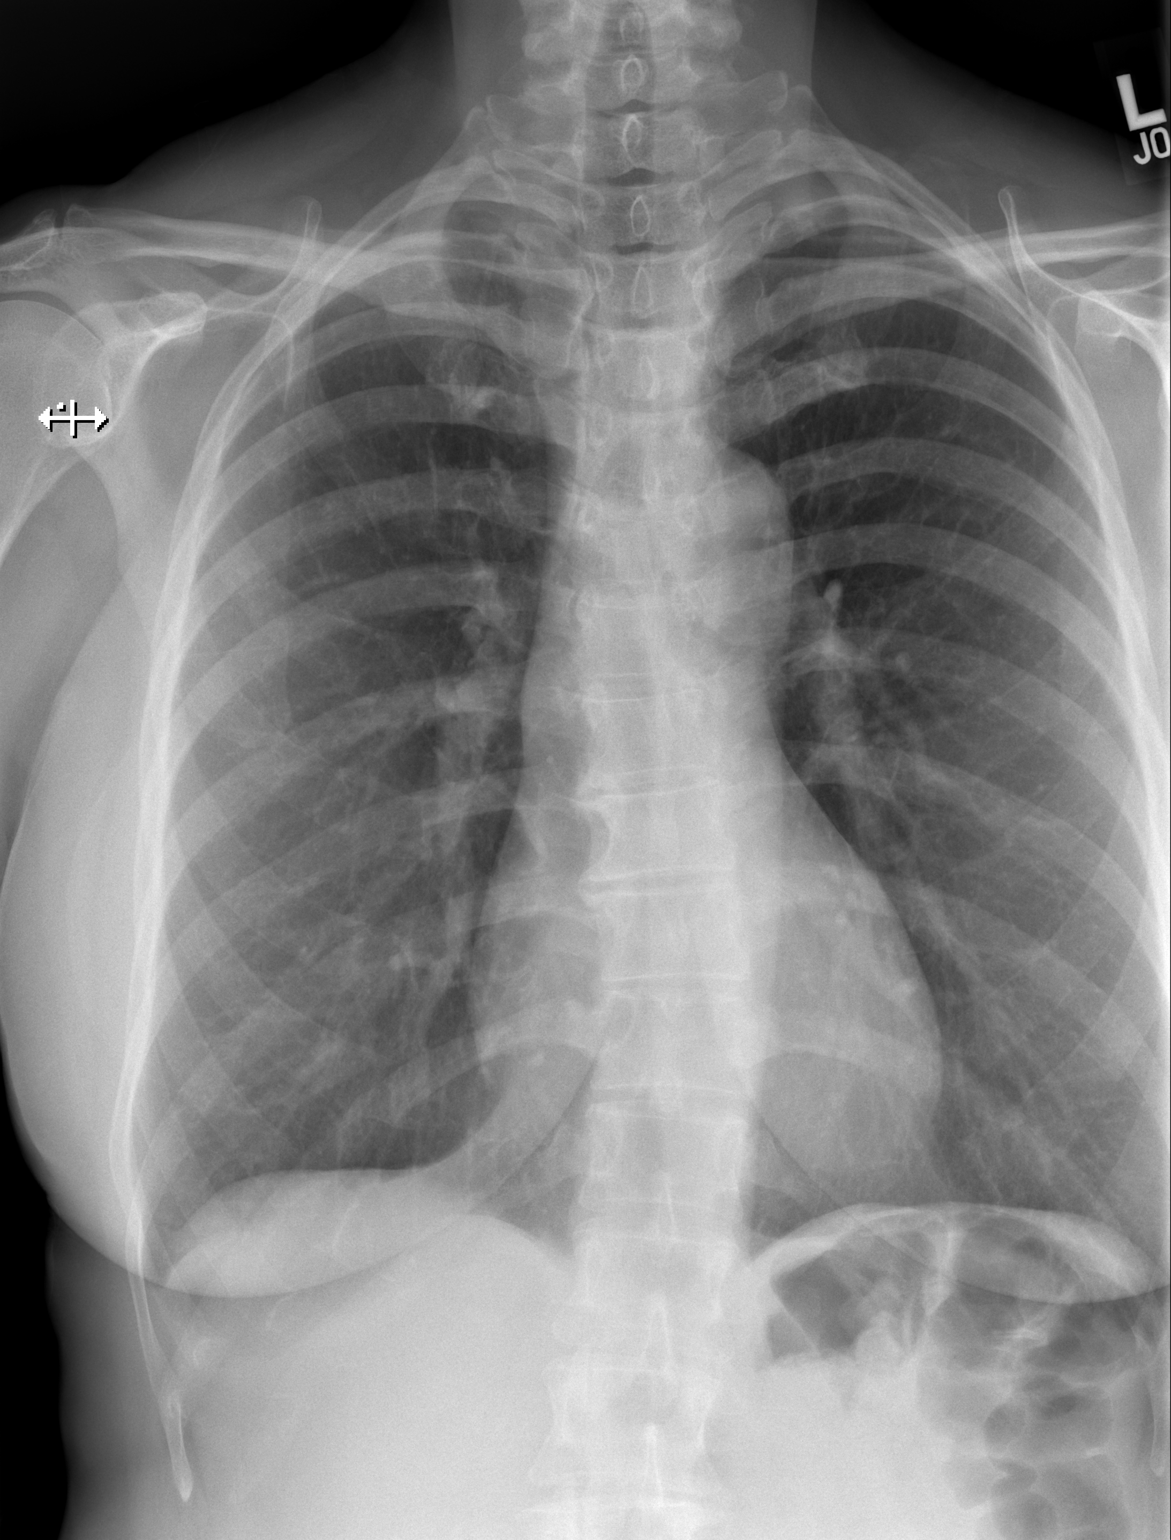

[w chest lat (1 of 2)]
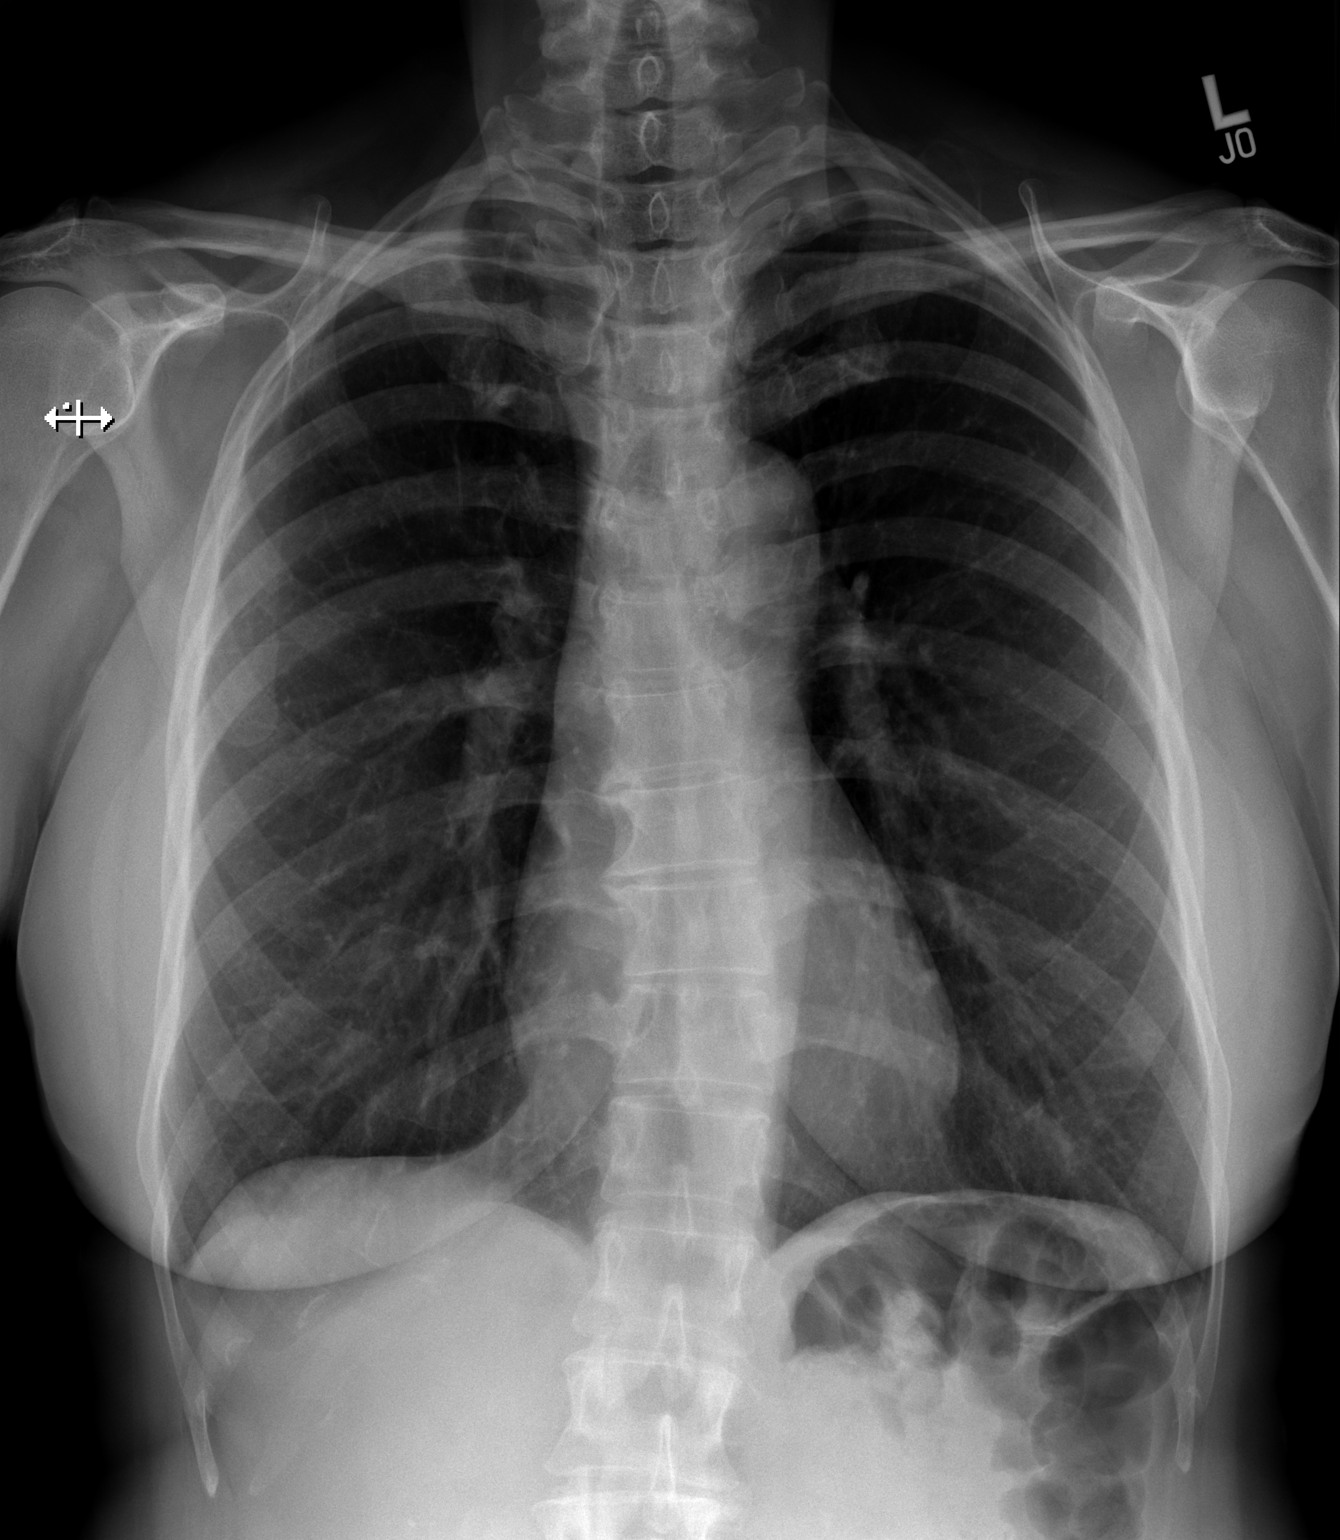

[w chest lat (2 of 2)]
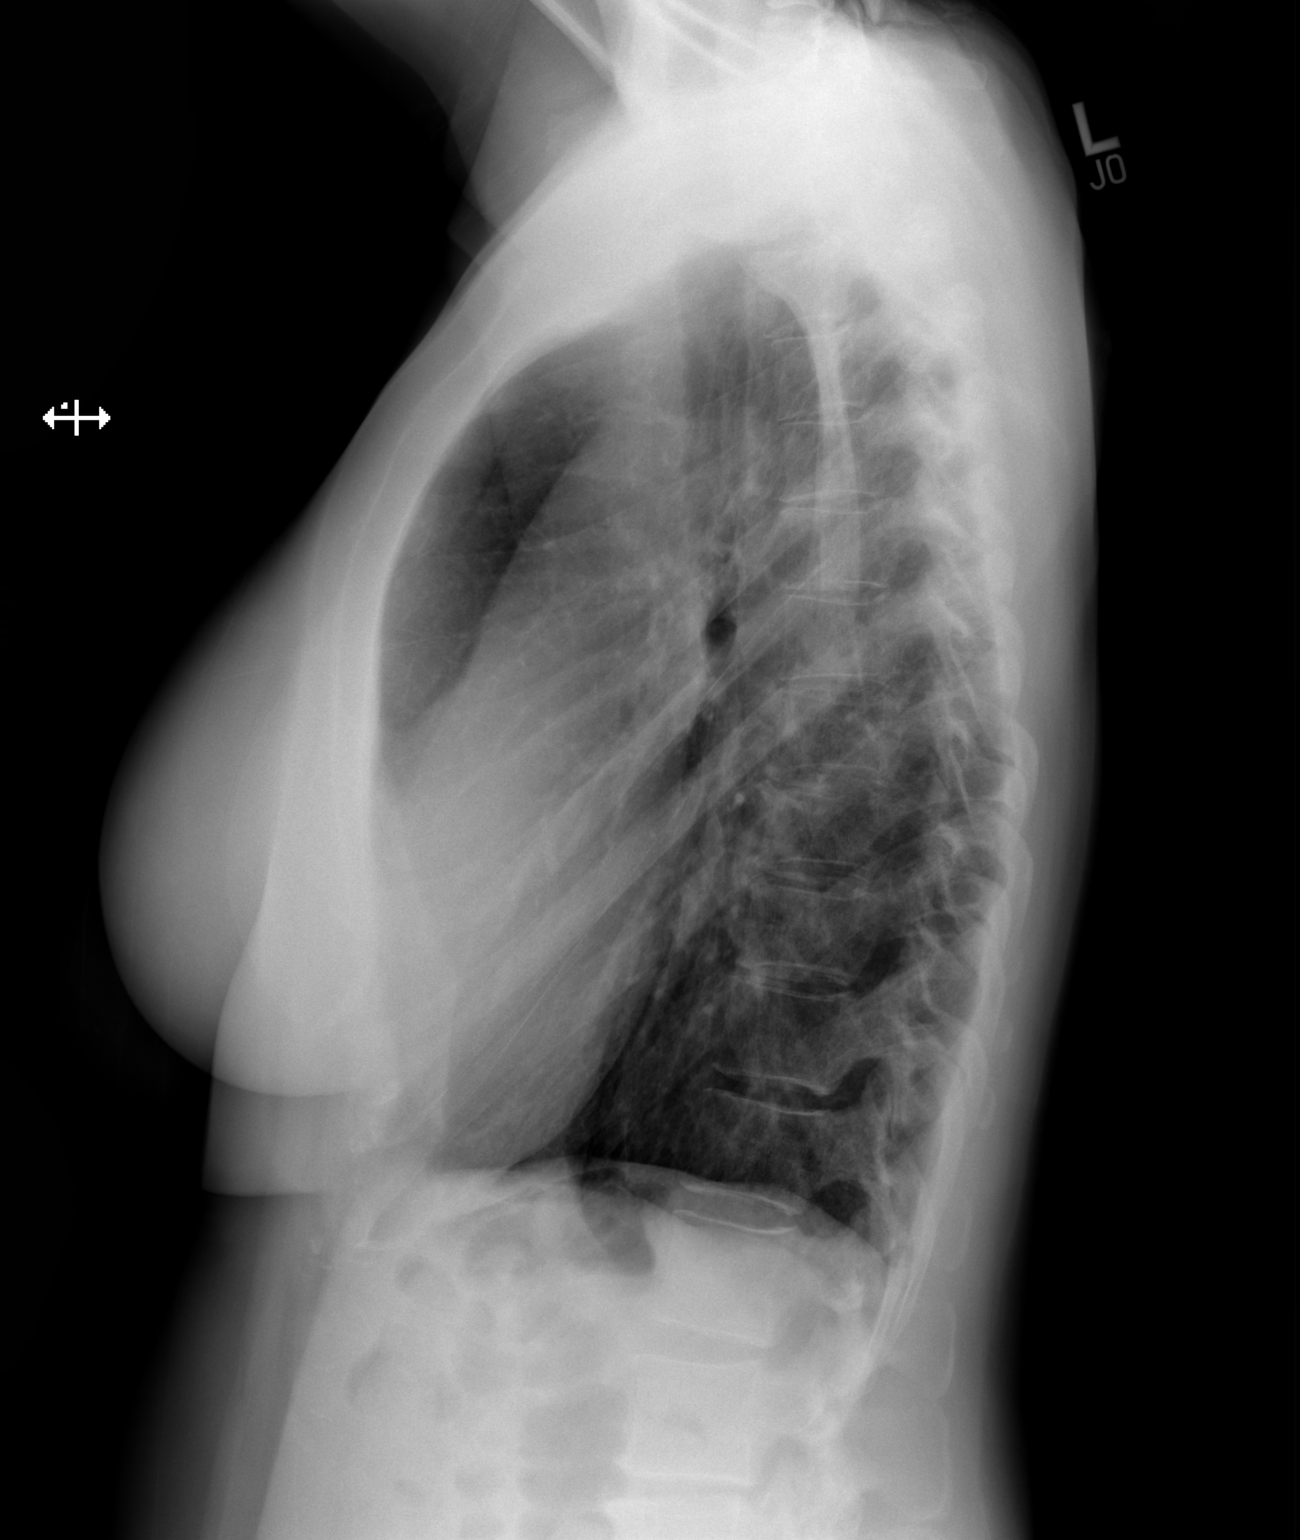

[3 of 3 positions shown; findings below may reference images not displayed]

FINDINGS: Lungs are clear. Heart size and pulmonary vascularity are normal. No
adenopathy. There is aortic atherosclerosis. There is lower thoracic
levoscoliosis.
IMPRESSION: No edema or consolidation.  There is aortic atherosclerosis.

Aortic Atherosclerosis ([id number]).

## 2019-06-08 DIAGNOSIS — M25511 Pain in right shoulder: Secondary | ICD-10-CM | POA: Diagnosis not present

## 2019-06-08 DIAGNOSIS — M542 Cervicalgia: Secondary | ICD-10-CM | POA: Diagnosis not present

## 2019-06-08 DIAGNOSIS — M47812 Spondylosis without myelopathy or radiculopathy, cervical region: Secondary | ICD-10-CM | POA: Diagnosis not present

## 2019-06-08 DIAGNOSIS — M5030 Other cervical disc degeneration, unspecified cervical region: Secondary | ICD-10-CM | POA: Diagnosis not present

## 2019-06-17 ENCOUNTER — Telehealth: Payer: Self-pay | Admitting: Internal Medicine

## 2019-06-17 NOTE — Telephone Encounter (Signed)
LVM to CB for Renal US results

## 2019-06-17 NOTE — Telephone Encounter (Signed)
There was bowel gas overlying the left kidney partially obscurring it but no gross abnormality in either kidney noted.

## 2019-06-17 NOTE — Telephone Encounter (Signed)
Patient called back, gave results, she verbalized understanding

## 2019-06-17 NOTE — Telephone Encounter (Signed)
Gabriela Green [phone]  [name] called to see if the results from her Renal US were in, due to [name] she did not have it done until 05/26/19

## 2019-08-02 ENCOUNTER — Other Ambulatory Visit: Payer: Self-pay

## 2019-08-16 ENCOUNTER — Other Ambulatory Visit: Payer: PPO | Admitting: Internal Medicine

## 2019-08-16 ENCOUNTER — Other Ambulatory Visit: Payer: Self-pay

## 2019-08-16 DIAGNOSIS — E7800 Pure hypercholesterolemia, unspecified: Secondary | ICD-10-CM | POA: Diagnosis not present

## 2019-08-16 DIAGNOSIS — K580 Irritable bowel syndrome with diarrhea: Secondary | ICD-10-CM | POA: Diagnosis not present

## 2019-08-16 DIAGNOSIS — M8580 Other specified disorders of bone density and structure, unspecified site: Secondary | ICD-10-CM

## 2019-08-16 DIAGNOSIS — Z0000 Encounter for general adult medical examination without abnormal findings: Secondary | ICD-10-CM

## 2019-08-16 DIAGNOSIS — E785 Hyperlipidemia, unspecified: Secondary | ICD-10-CM | POA: Diagnosis not present

## 2019-08-16 DIAGNOSIS — E559 Vitamin D deficiency, unspecified: Secondary | ICD-10-CM | POA: Diagnosis not present

## 2019-08-16 DIAGNOSIS — Z1329 Encounter for screening for other suspected endocrine disorder: Secondary | ICD-10-CM

## 2019-08-16 DIAGNOSIS — E2839 Other primary ovarian failure: Secondary | ICD-10-CM | POA: Diagnosis not present

## 2019-08-17 LAB — CBC WITH DIFFERENTIAL/PLATELET
Absolute Monocytes: 477 cells/uL (ref 200–950)
Basophils Absolute: 50 cells/uL (ref 0–200)
Basophils Relative: 1.1 %
Eosinophils Absolute: 99 cells/uL (ref 15–500)
Eosinophils Relative: 2.2 %
HCT: 39.2 % (ref 35.0–45.0)
Hemoglobin: 13.5 g/dL (ref 11.7–15.5)
Lymphs Abs: 1049 cells/uL (ref 850–3900)
MCH: 32.7 pg (ref 27.0–33.0)
MCHC: 34.4 g/dL (ref 32.0–36.0)
MCV: 94.9 fL (ref 80.0–100.0)
MPV: 10 fL (ref 7.5–12.5)
Monocytes Relative: 10.6 %
Neutro Abs: 2826 cells/uL (ref 1500–7800)
Neutrophils Relative %: 62.8 %
Platelets: 297 10*3/uL (ref 140–400)
RBC: 4.13 10*6/uL (ref 3.80–5.10)
RDW: 11.8 % (ref 11.0–15.0)
Total Lymphocyte: 23.3 %
WBC: 4.5 10*3/uL (ref 3.8–10.8)

## 2019-08-17 LAB — COMPLETE METABOLIC PANEL WITH GFR
AG Ratio: 1.4 (calc) (ref 1.0–2.5)
ALT: 8 U/L (ref 6–29)
AST: 13 U/L (ref 10–35)
Albumin: 4 g/dL (ref 3.6–5.1)
Alkaline phosphatase (APISO): 36 U/L — ABNORMAL LOW (ref 37–153)
BUN: 15 mg/dL (ref 7–25)
CO2: 27 mmol/L (ref 20–32)
Calcium: 9.2 mg/dL (ref 8.6–10.4)
Chloride: 103 mmol/L (ref 98–110)
Creat: 0.59 mg/dL (ref 0.50–0.99)
GFR, Est African American: 111 mL/min/{1.73_m2} (ref >=60)
GFR, Est Non African American: 96 mL/min/{1.73_m2} (ref >=60)
Globulin: 2.8 g/dL (calc) (ref 1.9–3.7)
Glucose, Bld: 92 mg/dL (ref 65–99)
Potassium: 4.4 mmol/L (ref 3.5–5.3)
Sodium: 138 mmol/L (ref 135–146)
Total Bilirubin: 0.6 mg/dL (ref 0.2–1.2)
Total Protein: 6.8 g/dL (ref 6.1–8.1)

## 2019-08-17 LAB — LIPID PANEL
Cholesterol: 195 mg/dL (ref <200)
HDL: 79 mg/dL (ref >=50)
LDL Cholesterol (Calc): 99 mg/dL (calc)
Non-HDL Cholesterol (Calc): 116 mg/dL (calc) (ref <130)
Total CHOL/HDL Ratio: 2.5 (calc) (ref <5.0)
Triglycerides: 84 mg/dL (ref <150)

## 2019-08-17 LAB — TSH: TSH: 1.79 mIU/L (ref 0.40–4.50)

## 2019-08-22 ENCOUNTER — Other Ambulatory Visit: Payer: PPO | Admitting: Internal Medicine

## 2019-08-23 ENCOUNTER — Ambulatory Visit (INDEPENDENT_AMBULATORY_CARE_PROVIDER_SITE_OTHER): Payer: PPO | Admitting: Internal Medicine

## 2019-08-23 ENCOUNTER — Other Ambulatory Visit: Payer: Self-pay

## 2019-08-23 VITALS — BP 110/80 | HR 76 | Temp 98.0°F | Ht 67.0 in | Wt 143.0 lb

## 2019-08-23 DIAGNOSIS — Z0000 Encounter for general adult medical examination without abnormal findings: Secondary | ICD-10-CM | POA: Diagnosis not present

## 2019-08-23 DIAGNOSIS — M79642 Pain in left hand: Secondary | ICD-10-CM

## 2019-08-23 DIAGNOSIS — M8580 Other specified disorders of bone density and structure, unspecified site: Secondary | ICD-10-CM

## 2019-08-23 DIAGNOSIS — S40022A Contusion of left upper arm, initial encounter: Secondary | ICD-10-CM | POA: Diagnosis not present

## 2019-08-23 DIAGNOSIS — K580 Irritable bowel syndrome with diarrhea: Secondary | ICD-10-CM | POA: Diagnosis not present

## 2019-08-23 LAB — POCT URINALYSIS DIPSTICK
Appearance: NEGATIVE
Bilirubin, UA: NEGATIVE
Blood, UA: NEGATIVE
Glucose, UA: NEGATIVE
Ketones, UA: NEGATIVE
Leukocytes, UA: NEGATIVE
Nitrite, UA: NEGATIVE
Odor: NEGATIVE
Protein, UA: NEGATIVE
Spec Grav, UA: 1.01 (ref 1.010–1.025)
Urobilinogen, UA: 0.2 E.U./dL
pH, UA: 6.5 (ref 5.0–8.0)

## 2019-08-23 MED ORDER — MUPIROCIN 2 % EX OINT: 1.0000 "application " | TOPICAL_OINTMENT | Freq: Two times a day (BID) | CUTANEOUS | 0 refills | Status: DC

## 2019-08-23 NOTE — Patient Instructions (Addendum)
Defer pneumococcal 23 until a future time when convenient. Bactroban ointment twice a day for abrasions as needed.  It was a pleasure to see you today.  Labs are within normal limits.

## 2019-08-23 NOTE — Progress Notes (Signed)
Subjective:    Patient ID: Gabriela Green, female    DOB: 11-01-1950, 68 y.o.   MRN: [id number]  HPI 68 year old Female in today for Medicare wellness, health maintenance exam and evaluation of medical issues.  Her general health is excellent.  She has a history of osteopenia.  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  History of allergic rhinitis.  Had breast implants 1998.  Hysterectomy BSO August 2005.  Herniated disc L5-S1 on the left with microdiscectomy done February 1996.  History of tinnitus which is felt to be benign.  Non-smoker.  Social alcohol consumption.  Colonoscopy by Dr. Sharlett Iles in 2009 repeat study done by Dr. Carlean Purl in 2019 with 10-year follow-up recommended.  Social history: Divorced with 2 adult daughters.  Has several grandchildren.  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  Family history: Father died with pneumonia.  2 brothers and 2 sisters.  1 sister with diabetes.  She is going out of town on business and we are going to defer pneumococcal 23 vaccine until a future time when it is convenient.  Review of Systems contusion/injury to left hand. Bactroban prescribed for minor abrasions.  May want to get left hand x-ray when she returns from out of town     Objective:   Physical Exam Blood pressure 110/80 pulse 76 temperature 98 degrees pulse oximetry 98% weight 143 pounds height 5 feet 7 inches  Skin warm and dry.  Nodes none.  Neck is supple without JVD thyromegaly or carotid bruits.  Chest clear to auscultation.  Breast without masses.  Cardiac exam regular rate and rhythm.  Abdomen no hepatosplenomegaly masses or tenderness.  Extremities without edema.  Neuro no focal deficits.  Thought affect and judgment are within normal limits.  Pelvic exam deferred due to TAH/BSO       Assessment & Plan:  Left hand contusion with pain the plan will to get x-ray upon return from out of town  Health maintenance deferred pneumococcal 23 vaccine until  she is back in town but she is leaving tomorrow for [place] for business  Bactroban prescribed for minor abrasions  Plan: Get pneumococcal 23 vaccine when convenient.  Return in 1 year or as needed.  Labs are reviewed and are entirely within normal limits.  Subjective:   Patient presents for Medicare Annual/Subsequent preventive examination.  Review Past Medical/Family/Social: See above   Risk Factors  Current exercise habits: Gets regular exercise Dietary issues discussed: Low-fat low carbohydrate  Cardiac risk factors: None  Depression Screen  (Note: if answer to either of the following is "Yes", a more complete depression screening is indicated)   Over the past two weeks, have you felt down, depressed or hopeless? No  Over the past two weeks, have you felt little interest or pleasure in doing things? No Have you lost interest or pleasure in daily life? No Do you often feel hopeless? No Do you cry easily over simple problems? No   Activities of Daily Living  In your present state of health, do you have any difficulty performing the following activities?:   Driving? No  Managing money? No  Feeding yourself? No  Getting from bed to chair? No  Climbing a flight of stairs? No  Preparing food and eating?: No  Bathing or showering? No  Getting dressed: No  Getting to the toilet? No  Using the toilet:No  Moving around from place to place: No  In the past year have you fallen or had  a near fall?:No  Are you sexually active?  Yes Do you have more than one partner? No   Hearing Difficulties: No  Do you often ask people to speak up or repeat themselves? No  Do you experience ringing or noises in your ears?  Yes Do you have difficulty understanding soft or whispered voices? No  Do you feel that you have a problem with memory? No Do you often misplace items? No    Home Safety:  Do you have a smoke alarm at your residence? Yes Do you have grab bars in the bathroom?   None Do you have throw rugs in your house?  Yes   Cognitive Testing  Alert? Yes Normal Appearance?Yes  Oriented to person? Yes Place? Yes  Time? Yes  Recall of three objects? Yes  Can perform simple calculations? Yes  Displays appropriate judgment?Yes  Can read the correct time from a watch face?Yes   List the Names of Other Physician/Practitioners you currently use:  See referral list for the physicians patient is currently seeing.     Review of Systems: See above   Objective:     General appearance: Appears younger than stated age and trim  Head: Normocephalic, without obvious abnormality, atraumatic  Eyes: conj clear, EOMi PEERLA  Ears: normal TM's and external ear canals both ears  Nose: Nares normal. Septum midline. Mucosa normal. No drainage or sinus tenderness.  Throat: lips, mucosa, and tongue normal; teeth and gums normal  Neck: no adenopathy, no carotid bruit, no JVD, supple, symmetrical, trachea midline and thyroid not enlarged, symmetric, no tenderness/mass/nodules  No CVA tenderness.  Lungs: clear to auscultation bilaterally  Breasts: normal appearance, no masses or tenderness Heart: regular rate and rhythm, S1, S2 normal, no murmur, click, rub or gallop  Abdomen: soft, non-tender; bowel sounds normal; no masses, no organomegaly  Musculoskeletal: ROM normal in all joints, no crepitus, no deformity, Normal muscle strengthen. Back  is symmetric, no curvature. Skin: Skin color, texture, turgor normal. No rashes or lesions  Lymph nodes: Cervical, supraclavicular, and axillary nodes normal.  Neurologic: CN 2 -12 Normal, Normal symmetric reflexes. Normal coordination and gait  Psych: Alert & Oriented x 3, Mood appear stable.    Assessment:    Annual wellness medicare exam   Plan:    During the course of the visit the patient was educated and counseled about appropriate screening and preventive services including:    See above    Patient Instructions (the  written plan) was given to the patient.  Medicare Attestation  I have personally reviewed:  The patient's medical and social history  Their use of alcohol, tobacco or illicit drugs  Their current medications and supplements  The patient's functional ability including ADLs,fall risks, home safety risks, cognitive, and hearing and visual impairment  Diet and physical activities  Evidence for depression or mood disorders  The patient's weight, height, BMI, and visual acuity have been recorded in the chart. I have made referrals, counseling, and provided education to the patient based on review of the above and I have provided the patient with a written personalized care plan for preventive services.

## 2019-09-07 ENCOUNTER — Encounter: Payer: Self-pay | Admitting: Internal Medicine

## 2019-09-15 DIAGNOSIS — L710 Perioral dermatitis: Secondary | ICD-10-CM | POA: Diagnosis not present

## 2019-10-21 ENCOUNTER — Telehealth: Payer: Self-pay

## 2019-10-21 NOTE — Telephone Encounter (Signed)
Left detailed message. PT rx will be mailed.

## 2019-10-21 NOTE — Telephone Encounter (Signed)
I can write an Rx for PT where does she want to go or does she just want [place] to mail the Rx to her?

## 2019-10-21 NOTE — Telephone Encounter (Signed)
Patient called, she said last time she was here you and her discussed her pain on her right lower back that has been going on for 4-6 months now. She wants to know if you would write her a prescription for PT or does she need to have an [place] to see what's going on?

## 2019-10-24 NOTE — Telephone Encounter (Signed)
Patient called back she would like this rx mailed to her home.

## 2020-01-06 DIAGNOSIS — M25652 Stiffness of left hip, not elsewhere classified: Secondary | ICD-10-CM | POA: Diagnosis not present

## 2020-01-06 DIAGNOSIS — M25552 Pain in left hip: Secondary | ICD-10-CM | POA: Diagnosis not present

## 2020-01-06 DIAGNOSIS — M545 Low back pain: Secondary | ICD-10-CM | POA: Diagnosis not present

## 2020-01-06 DIAGNOSIS — M25551 Pain in right hip: Secondary | ICD-10-CM | POA: Diagnosis not present

## 2020-01-16 ENCOUNTER — Ambulatory Visit (INDEPENDENT_AMBULATORY_CARE_PROVIDER_SITE_OTHER): Payer: PPO | Admitting: Internal Medicine

## 2020-01-16 ENCOUNTER — Encounter: Payer: Self-pay | Admitting: Internal Medicine

## 2020-01-16 ENCOUNTER — Other Ambulatory Visit: Payer: Self-pay

## 2020-01-16 VITALS — BP 110/70 | HR 74 | Temp 98.0°F | Ht 67.0 in | Wt 143.0 lb

## 2020-01-16 DIAGNOSIS — R55 Syncope and collapse: Secondary | ICD-10-CM

## 2020-01-16 DIAGNOSIS — M7062 Trochanteric bursitis, left hip: Secondary | ICD-10-CM

## 2020-02-02 ENCOUNTER — Other Ambulatory Visit: Payer: Self-pay | Admitting: Internal Medicine

## 2020-02-02 DIAGNOSIS — Z1231 Encounter for screening mammogram for malignant neoplasm of breast: Secondary | ICD-10-CM

## 2020-02-04 NOTE — Progress Notes (Deleted)
   Subjective:    Patient ID: Gabriela Green, female    DOB: [birth year], 69 y.o.   MRN: [id number]  HPI    Review of Systems     Objective:   Physical Exam        Assessment & Plan:

## 2020-02-04 NOTE — Patient Instructions (Addendum)
Suggest orthopedic referral for hip pain.  They can do x-rays if so desired.  Suspect syncope was benign as it has not recurred and there is no cardiac history.  Could have been vasovagal syncope.  Can refer to neurologist if recurs or patient desires.

## 2020-02-04 NOTE — Progress Notes (Signed)
   Subjective:    Patient ID: Gabriela Green, female    DOB: 01/03/1951, 69 y.o.   MRN: [id number]  HPI Patient has been going to physical therapy regarding left lower back pain.  Physical therapist thinks she may need hip x-ray as she has not improved significantly apparently.  She does have some pain over her left trochanter.  She exercises regularly.  She stays in good shape.  Her weight is excellent.  Have suggested orthopedic consultation and they can proceed with hip x-ray.  Recently she had a syncopal episode at home.  Maybe has had a couple of glasses of wine.  Not sure what caused the syncope.  Was not under any excessive stress and had been resting in bed.  She has no prior history of this type of syncope.  She has no history of cardiac disorder.  Had no chest pain or shortness of breath.  This was frightening to her.  Explained to her that episodes of syncope are not uncommon and generally an extensive work-up does not reveal any source if she does not have ongoing symptoms. She has no history of hypertension.  No history of heart disease.  No history of seizure disorder.   Review of Systems     Objective:   Physical Exam Blood pressure 110/70 pulse 74 temperature 98 degrees orally pulse oximetry weight 143 pounds BMI 22.40 skin warm and dry.  Nodes none.  Neck is supple.  Chest clear to auscultation.  Cardiac exam regular rate and rhythm normal S1 and S2 without murmurs or gallops.  Brief neurological exam: Intact without focal deficits.  She is alert and oriented x3.       Assessment & Plan:  Left hip pain-she is tender over her left trochanter and may have trochanteric bursitis in addition to her low back pain on her left side.  Recommend orthopedic evaluation.  She may be a candidate for injection of trochanteric bursa.  Syncope-etiology unclear.  This occurred at home.  Suspect it was some type of vasovagal syncope.  Continue to monitor.  Patient can have neurology referral if she  should so desire but I am not sure what the work-up would give [place] any answers if it was an isolated event.  She currently has no cardiac dysrhythmia or focal neurological deficits.

## 2020-03-07 ENCOUNTER — Ambulatory Visit: Payer: PPO

## 2020-03-07 ENCOUNTER — Telehealth: Payer: Self-pay | Admitting: Internal Medicine

## 2020-03-07 NOTE — Telephone Encounter (Signed)
Gabriela Green [phone]  [name] called and would like to come in for labs to be drawn for an upcoming surgery with Dr Art gallery manager at [facility]. So I had her have them fax what he needs, It is a CBC (no Diff ICD 10 code D50.9 they would like results fax to [phone] and to call his assistant Sarah with any questions. Brittanya would like to come here to have these labs drawn.

## 2020-03-07 NOTE — Telephone Encounter (Signed)
OK to have CBC with diff done for upcoming surgery with Dr. Stephanie Coup

## 2020-03-16 ENCOUNTER — Other Ambulatory Visit: Payer: Self-pay

## 2020-03-16 ENCOUNTER — Other Ambulatory Visit: Payer: PPO | Admitting: Internal Medicine

## 2020-03-16 DIAGNOSIS — Z01818 Encounter for other preprocedural examination: Secondary | ICD-10-CM | POA: Diagnosis not present

## 2020-03-16 LAB — CBC WITH DIFFERENTIAL/PLATELET
Absolute Monocytes: 482 cells/uL (ref 200–950)
Basophils Absolute: 52 cells/uL (ref 0–200)
Basophils Relative: 1.2 %
Eosinophils Absolute: 159 cells/uL (ref 15–500)
Eosinophils Relative: 3.7 %
HCT: 40.5 % (ref 35.0–45.0)
Hemoglobin: 13.6 g/dL (ref 11.7–15.5)
Lymphs Abs: 1079 cells/uL (ref 850–3900)
MCH: 31.6 pg (ref 27.0–33.0)
MCHC: 33.6 g/dL (ref 32.0–36.0)
MCV: 94 fL (ref 80.0–100.0)
MPV: 10 fL (ref 7.5–12.5)
Monocytes Relative: 11.2 %
Neutro Abs: 2528 cells/uL (ref 1500–7800)
Neutrophils Relative %: 58.8 %
Platelets: 315 10*3/uL (ref 140–400)
RBC: 4.31 10*6/uL (ref 3.80–5.10)
RDW: 11.6 % (ref 11.0–15.0)
Total Lymphocyte: 25.1 %
WBC: 4.3 10*3/uL (ref 3.8–10.8)

## 2020-03-20 ENCOUNTER — Other Ambulatory Visit: Payer: PPO | Admitting: Internal Medicine

## 2020-03-21 ENCOUNTER — Ambulatory Visit
Admission: RE | Admit: 2020-03-21 | Discharge: 2020-03-21 | Disposition: A | Discharge status: Home / Self Care | Payer: PPO | Source: Ambulatory Visit | Attending: Internal Medicine | Admitting: Internal Medicine

## 2020-03-21 ENCOUNTER — Other Ambulatory Visit: Payer: Self-pay

## 2020-03-21 DIAGNOSIS — Z1231 Encounter for screening mammogram for malignant neoplasm of breast: Secondary | ICD-10-CM

## 2020-05-30 DIAGNOSIS — Z01419 Encounter for gynecological examination (general) (routine) without abnormal findings: Secondary | ICD-10-CM | POA: Diagnosis not present

## 2020-05-30 DIAGNOSIS — Z6821 Body mass index (BMI) 21.0-21.9, adult: Secondary | ICD-10-CM | POA: Diagnosis not present

## 2020-05-30 DIAGNOSIS — B009 Herpesviral infection, unspecified: Secondary | ICD-10-CM | POA: Diagnosis not present

## 2020-05-30 DIAGNOSIS — N951 Menopausal and female climacteric states: Secondary | ICD-10-CM | POA: Diagnosis not present

## 2020-06-13 ENCOUNTER — Ambulatory Visit (INDEPENDENT_AMBULATORY_CARE_PROVIDER_SITE_OTHER): Payer: PPO | Admitting: Internal Medicine

## 2020-06-13 ENCOUNTER — Other Ambulatory Visit: Payer: Self-pay

## 2020-06-13 ENCOUNTER — Encounter: Payer: Self-pay | Admitting: Internal Medicine

## 2020-06-13 VITALS — BP 110/80 | HR 64 | Temp 97.8°F | Ht 67.0 in | Wt 143.0 lb

## 2020-06-13 DIAGNOSIS — Z23 Encounter for immunization: Secondary | ICD-10-CM | POA: Diagnosis not present

## 2020-06-13 NOTE — Patient Instructions (Signed)
Patient received a flu vaccine IM L deltoid, AV, CMA  

## 2020-06-13 NOTE — Progress Notes (Signed)
Flu Vaccine given by CMA 

## 2020-06-20 ENCOUNTER — Ambulatory Visit: Payer: PPO | Admitting: Internal Medicine

## 2020-07-03 DIAGNOSIS — F4323 Adjustment disorder with mixed anxiety and depressed mood: Secondary | ICD-10-CM | POA: Diagnosis not present

## 2020-07-08 ENCOUNTER — Telehealth: Payer: Self-pay | Admitting: Internal Medicine

## 2020-07-08 MED ORDER — AZITHROMYCIN 250 MG PO TABS
ORAL_TABLET | ORAL | 0 refills | Status: DC
Start: 2020-07-08 — End: 2020-08-23

## 2020-07-08 MED ORDER — AZITHROMYCIN 250 MG PO TABS
ORAL_TABLET | ORAL | 0 refills | Status: DC
Start: 2020-07-08 — End: 2020-07-08

## 2020-07-08 NOTE — Telephone Encounter (Signed)
[facility] on [address] in closed today. Patient called to tell me. So, I  cancelled Rx there.  Sending Rx for Z-pak to [facility] on [address].

## 2020-07-08 NOTE — Telephone Encounter (Signed)
Patient called this morning complaining of respiratory congestion and scratchy throat. Has had Covid Booster. No known Covid exposure. Has to travel by [place] on Wednesday to [place] for work. Office is closed today. Referred to Urgent care for Covid testing. Call in Zithromax Z-pak. Quarantine at home until test result is back.  MJB, M.D.

## 2020-07-09 DIAGNOSIS — F4323 Adjustment disorder with mixed anxiety and depressed mood: Secondary | ICD-10-CM | POA: Diagnosis not present

## 2020-07-19 DIAGNOSIS — F4323 Adjustment disorder with mixed anxiety and depressed mood: Secondary | ICD-10-CM | POA: Diagnosis not present

## 2020-08-11 DIAGNOSIS — F4323 Adjustment disorder with mixed anxiety and depressed mood: Secondary | ICD-10-CM | POA: Diagnosis not present

## 2020-08-21 ENCOUNTER — Other Ambulatory Visit: Payer: Self-pay

## 2020-08-21 ENCOUNTER — Other Ambulatory Visit: Payer: PPO | Admitting: Internal Medicine

## 2020-08-21 DIAGNOSIS — Z13220 Encounter for screening for lipoid disorders: Secondary | ICD-10-CM

## 2020-08-21 DIAGNOSIS — Z131 Encounter for screening for diabetes mellitus: Secondary | ICD-10-CM | POA: Diagnosis not present

## 2020-08-21 DIAGNOSIS — E559 Vitamin D deficiency, unspecified: Secondary | ICD-10-CM | POA: Diagnosis not present

## 2020-08-21 DIAGNOSIS — Z0000 Encounter for general adult medical examination without abnormal findings: Secondary | ICD-10-CM

## 2020-08-21 DIAGNOSIS — Z1329 Encounter for screening for other suspected endocrine disorder: Secondary | ICD-10-CM

## 2020-08-22 LAB — CBC WITH DIFFERENTIAL/PLATELET
Absolute Monocytes: 446 cells/uL (ref 200–950)
Basophils Absolute: 60 cells/uL (ref 0–200)
Basophils Relative: 1.3 %
Eosinophils Absolute: 285 cells/uL (ref 15–500)
Eosinophils Relative: 6.2 %
HCT: 40.7 % (ref 35.0–45.0)
Hemoglobin: 14 g/dL (ref 11.7–15.5)
Lymphs Abs: 1132 cells/uL (ref 850–3900)
MCH: 32.7 pg (ref 27.0–33.0)
MCHC: 34.4 g/dL (ref 32.0–36.0)
MCV: 95.1 fL (ref 80.0–100.0)
MPV: 9.9 fL (ref 7.5–12.5)
Monocytes Relative: 9.7 %
Neutro Abs: 2677 cells/uL (ref 1500–7800)
Neutrophils Relative %: 58.2 %
Platelets: 315 10*3/uL (ref 140–400)
RBC: 4.28 10*6/uL (ref 3.80–5.10)
RDW: 11.5 % (ref 11.0–15.0)
Total Lymphocyte: 24.6 %
WBC: 4.6 10*3/uL (ref 3.8–10.8)

## 2020-08-22 LAB — HEMOGLOBIN A1C
Hgb A1c MFr Bld: 5.2 % of total Hgb (ref <5.7)
Mean Plasma Glucose: 103 mg/dL
eAG (mmol/L): 5.7 mmol/L

## 2020-08-22 LAB — COMPLETE METABOLIC PANEL WITH GFR
AG Ratio: 1.6 (calc) (ref 1.0–2.5)
ALT: 12 U/L (ref 6–29)
AST: 13 U/L (ref 10–35)
Albumin: 4.1 g/dL (ref 3.6–5.1)
Alkaline phosphatase (APISO): 37 U/L (ref 37–153)
BUN: 13 mg/dL (ref 7–25)
CO2: 28 mmol/L (ref 20–32)
Calcium: 9.4 mg/dL (ref 8.6–10.4)
Chloride: 103 mmol/L (ref 98–110)
Creat: 0.55 mg/dL (ref 0.50–0.99)
GFR, Est African American: 112 mL/min/{1.73_m2} (ref >=60)
GFR, Est Non African American: 97 mL/min/{1.73_m2} (ref >=60)
Globulin: 2.6 g/dL (calc) (ref 1.9–3.7)
Glucose, Bld: 82 mg/dL (ref 65–99)
Potassium: 5.3 mmol/L (ref 3.5–5.3)
Sodium: 138 mmol/L (ref 135–146)
Total Bilirubin: 0.4 mg/dL (ref 0.2–1.2)
Total Protein: 6.7 g/dL (ref 6.1–8.1)

## 2020-08-22 LAB — TSH: TSH: 1.88 mIU/L (ref 0.40–4.50)

## 2020-08-22 LAB — LIPID PANEL
Cholesterol: 211 mg/dL — ABNORMAL HIGH (ref <200)
HDL: 78 mg/dL (ref >=50)
LDL Cholesterol (Calc): 113 mg/dL (calc) — ABNORMAL HIGH
Non-HDL Cholesterol (Calc): 133 mg/dL (calc) — ABNORMAL HIGH (ref <130)
Total CHOL/HDL Ratio: 2.7 (calc) (ref <5.0)
Triglycerides: 95 mg/dL (ref <150)

## 2020-08-22 LAB — VITAMIN D 25 HYDROXY (VIT D DEFICIENCY, FRACTURES): Vit D, 25-Hydroxy: 38 ng/mL (ref 30–100)

## 2020-08-23 ENCOUNTER — Ambulatory Visit (INDEPENDENT_AMBULATORY_CARE_PROVIDER_SITE_OTHER): Payer: PPO | Admitting: Internal Medicine

## 2020-08-23 ENCOUNTER — Other Ambulatory Visit: Payer: Self-pay

## 2020-08-23 ENCOUNTER — Encounter: Payer: Self-pay | Admitting: Internal Medicine

## 2020-08-23 VITALS — BP 122/78 | HR 66 | Ht 67.0 in | Wt 142.0 lb

## 2020-08-23 DIAGNOSIS — Z0000 Encounter for general adult medical examination without abnormal findings: Secondary | ICD-10-CM

## 2020-08-23 DIAGNOSIS — M720 Palmar fascial fibromatosis [Dupuytren]: Secondary | ICD-10-CM

## 2020-08-23 DIAGNOSIS — K580 Irritable bowel syndrome with diarrhea: Secondary | ICD-10-CM

## 2020-08-23 DIAGNOSIS — E7800 Pure hypercholesterolemia, unspecified: Secondary | ICD-10-CM

## 2020-08-23 DIAGNOSIS — Z9882 Breast implant status: Secondary | ICD-10-CM | POA: Diagnosis not present

## 2020-08-23 DIAGNOSIS — Z129 Encounter for screening for malignant neoplasm, site unspecified: Secondary | ICD-10-CM | POA: Diagnosis not present

## 2020-08-23 LAB — POCT URINALYSIS DIPSTICK
Bilirubin, UA: NEGATIVE
Blood, UA: NEGATIVE
Glucose, UA: NEGATIVE
Ketones, UA: NEGATIVE
Leukocytes, UA: NEGATIVE
Nitrite, UA: NEGATIVE
Protein, UA: NEGATIVE
Spec Grav, UA: 1.01 (ref 1.010–1.025)
Urobilinogen, UA: 0.2 E.U./dL
pH, UA: 8 (ref 5.0–8.0)

## 2020-08-23 MED ORDER — HYOSCYAMINE SULFATE SL 0.125 MG SL SUBL: SUBLINGUAL_TABLET | SUBLINGUAL | 6 refills | Status: DC
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
## 2020-08-23 NOTE — Patient Instructions (Signed)
It was a pleasure to see you today.  Levsin sublingual tablets have been refilled for irritable bowel syndrome.  Recommend annual mammogram.  Continue diet and exercise regimen.  Follow-up in 1 year.

## 2020-08-24 ENCOUNTER — Telehealth: Payer: Self-pay

## 2020-08-24 LAB — CANCER ANTIGEN 19-9: CA 19-9: 23 U/mL (ref <34)

## 2020-08-24 NOTE — Telephone Encounter (Signed)
-----   Message from Elby Showers, MD sent at 08/24/2020  3:15 PM EST ----- Please call her. Test for pancreatic cancer is negative

## 2020-08-24 NOTE — Telephone Encounter (Signed)
Informed patient of results

## 2021-02-15 ENCOUNTER — Other Ambulatory Visit: Payer: Self-pay | Admitting: Internal Medicine

## 2021-02-15 DIAGNOSIS — Z1231 Encounter for screening mammogram for malignant neoplasm of breast: Secondary | ICD-10-CM

## 2021-03-07 ENCOUNTER — Other Ambulatory Visit: Payer: Self-pay

## 2021-03-07 ENCOUNTER — Ambulatory Visit (INDEPENDENT_AMBULATORY_CARE_PROVIDER_SITE_OTHER): Payer: PPO | Admitting: Internal Medicine

## 2021-03-07 ENCOUNTER — Telehealth: Payer: Self-pay

## 2021-03-07 ENCOUNTER — Encounter: Payer: Self-pay | Admitting: Internal Medicine

## 2021-03-07 VITALS — HR 83 | Temp 98.6°F | Ht 67.0 in

## 2021-03-07 DIAGNOSIS — J069 Acute upper respiratory infection, unspecified: Secondary | ICD-10-CM | POA: Diagnosis not present

## 2021-03-07 MED ORDER — FLUCONAZOLE 150 MG PO TABS
150.0000 mg | ORAL_TABLET | Freq: Once | ORAL | 0 refills | Status: AC
Start: 2021-03-07 — End: 2021-03-07

## 2021-03-07 MED ORDER — AZITHROMYCIN 250 MG PO TABS: ORAL_TABLET | ORAL | 0 refills | Status: AC

## 2021-03-07 MED ORDER — HYDROCODONE BIT-HOMATROP MBR 5-1.5 MG/5ML PO SOLN
5.0000 mL | Freq: Three times a day (TID) | ORAL | 0 refills | Status: DC | PRN
Start: 2021-03-07 — End: 2021-07-30

## 2021-03-07 NOTE — Telephone Encounter (Signed)
[place] test is negative, scheduled for car visit

## 2021-03-07 NOTE — Telephone Encounter (Signed)
Sore throat, chest congestion, hoarse. Symptoms began on Saturday. Temperature is 98.6. Yesterday covid test was negative.  Per Dr. Renold Genta patient needs another COVID test today and call back with results.

## 2021-03-25 ENCOUNTER — Ambulatory Visit: Payer: PPO

## 2021-03-31 NOTE — Progress Notes (Signed)
   Subjective:    Patient ID: Gabriela Green, female    DOB: [birth year], 70 y.o.   MRN: [id number]  HPI  Pleasant 70 year old Female seen today with sore throat, chest congestion and hoarseness.  Symptoms began Saturday, June 25.  Currently afebrile.  COVID test yesterday was negative and COVID test today at home is also negative.  Her general health is excellent.  No known COVID-19 exposure.  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  She has had 3 COVID-19 immunizations.  Review of Systems denies nausea, vomiting, significant headache, fever or chills.  No dysgeusia.     Objective:   Physical Exam Pharynx slightly injected.  TMs clear.  Chest is clear to auscultation without rales or wheezing.  Temperature 98.6 degrees pulse oximetry 98% height 5 feet 7 inches BMI 22.24       Assessment & Plan:   Acute upper respiratory infection  Plan: Zithromax Z-PAK 2 tabs day 1 followed by 1 tab days 2 through 5 .when symptoms have cleared.  Hycodan 1 teaspoon every 8 hours as needed for cough and sore throat pain.  Rest and drink plenty of fluids.  Diflucan if needed for Candida vaginitis

## 2021-03-31 NOTE — Patient Instructions (Addendum)
Take Zithromax Z-PAK 2 tabs day 1 followed by 1 tab days 2 through 5.  Take Hycodan 1 teaspoon every 8 hours as needed for cough and sore throat pain.  Rest and drink plenty of fluids.  Diflucan if needed for Candida vaginitis.

## 2021-04-10 ENCOUNTER — Ambulatory Visit: Payer: PPO

## 2021-05-16 ENCOUNTER — Ambulatory Visit: Payer: PPO

## 2021-05-24 DIAGNOSIS — F438 Other reactions to severe stress: Secondary | ICD-10-CM | POA: Diagnosis not present

## 2021-05-28 DIAGNOSIS — F438 Other reactions to severe stress: Secondary | ICD-10-CM | POA: Diagnosis not present

## 2021-06-18 ENCOUNTER — Ambulatory Visit: Payer: PPO

## 2021-06-18 DIAGNOSIS — F4389 Other reactions to severe stress: Secondary | ICD-10-CM | POA: Diagnosis not present

## 2021-06-25 DIAGNOSIS — F4389 Other reactions to severe stress: Secondary | ICD-10-CM | POA: Diagnosis not present

## 2021-07-09 DIAGNOSIS — F4389 Other reactions to severe stress: Secondary | ICD-10-CM | POA: Diagnosis not present

## 2021-07-12 ENCOUNTER — Ambulatory Visit
Admission: RE | Admit: 2021-07-12 | Discharge: 2021-07-12 | Disposition: A | Discharge status: Home / Self Care | Payer: PPO | Source: Ambulatory Visit | Attending: Internal Medicine | Admitting: Internal Medicine

## 2021-07-12 ENCOUNTER — Other Ambulatory Visit: Payer: Self-pay

## 2021-07-12 DIAGNOSIS — Z1231 Encounter for screening mammogram for malignant neoplasm of breast: Secondary | ICD-10-CM | POA: Diagnosis not present

## 2021-07-17 DIAGNOSIS — Z01419 Encounter for gynecological examination (general) (routine) without abnormal findings: Secondary | ICD-10-CM | POA: Diagnosis not present

## 2021-07-17 DIAGNOSIS — Z6821 Body mass index (BMI) 21.0-21.9, adult: Secondary | ICD-10-CM | POA: Diagnosis not present

## 2021-07-30 ENCOUNTER — Encounter: Payer: Self-pay | Admitting: Internal Medicine

## 2021-07-30 ENCOUNTER — Ambulatory Visit (INDEPENDENT_AMBULATORY_CARE_PROVIDER_SITE_OTHER): Payer: PPO | Admitting: Internal Medicine

## 2021-07-30 ENCOUNTER — Other Ambulatory Visit: Payer: Self-pay

## 2021-07-30 VITALS — HR 72 | Temp 98.8°F | Resp 16

## 2021-07-30 DIAGNOSIS — R059 Cough, unspecified: Secondary | ICD-10-CM | POA: Diagnosis not present

## 2021-07-30 DIAGNOSIS — J069 Acute upper respiratory infection, unspecified: Secondary | ICD-10-CM | POA: Diagnosis not present

## 2021-07-30 DIAGNOSIS — J988 Other specified respiratory disorders: Secondary | ICD-10-CM

## 2021-07-30 LAB — POCT INFLUENZA A/B
Influenza A, POC: NEGATIVE
Influenza B, POC: NEGATIVE

## 2021-07-30 MED ORDER — HYDROCODONE BIT-HOMATROP MBR 5-1.5 MG/5ML PO SOLN
5.0000 mL | Freq: Three times a day (TID) | ORAL | 0 refills | Status: DC | PRN
Start: 2021-07-30 — End: 2021-09-04

## 2021-07-30 MED ORDER — AZITHROMYCIN 250 MG PO TABS
ORAL_TABLET | ORAL | 0 refills | Status: AC
Start: 2021-07-30 — End: 2021-08-04

## 2021-07-30 MED ORDER — FLUCONAZOLE 150 MG PO TABS: 150.0000 mg | ORAL_TABLET | Freq: Once | ORAL | 1 refills | Status: AC

## 2021-07-30 MED ORDER — BENZONATATE 100 MG PO CAPS
100.0000 mg | ORAL_CAPSULE | Freq: Three times a day (TID) | ORAL | 0 refills | Status: DC | PRN
Start: 2021-07-30 — End: 2021-10-18

## 2021-07-30 NOTE — Progress Notes (Signed)
   Subjective:    Patient ID: Gabriela Green, female    DOB: 02-Dec-1950, 70 y.o.   MRN: [id number]  HPI 70 year old Female seen today with cough and hoarseness.  Feels that she likely has an upper respiratory infection.  Has no known COVID exposure.  Had COVID-vaccine booster in May 2022.  Has not had flu vaccine yet.  Would like to feel better for Thanksgiving.  General health is excellent.  No chronic illnesses.    Review of Systems see above has malaise and fatigue.  Tired of coughing.     Objective:   Physical Exam Temperature 98.8 degrees via ear thermometer, respiratory rate 16 pulse 72 regular pulse oximetry 97%.  She sounds a bit hoarse when she speaks.  Her pharynx is slightly injected.  TMs slightly full but not red or dull.  Chest is clear to auscultation.  Rapid flu test is negative  Respiratory virus panel was obtained and was resulted on November 25 positive for Rhinovirus     Assessment & Plan:   Viral respiratory infection  Plan: Tessalon Perles 100 mg 3 times daily as needed for cough.  Zithromax Z-PAK 2 tabs day 1 followed by 1 tab days 2 through 5.  Hycodan 1 teaspoon every 8 hours as needed for severe cough.  Rest and drink plenty of fluids.

## 2021-08-02 LAB — RESPIRATORY VIRUS PANEL
Adenovirus B: NOT DETECTED
HUMAN PARAINFLU VIRUS 1: NOT DETECTED
HUMAN PARAINFLU VIRUS 2: NOT DETECTED
HUMAN PARAINFLU VIRUS 3: NOT DETECTED
INFLUENZA A SUBTYPE H1: NOT DETECTED
INFLUENZA A SUBTYPE H3: NOT DETECTED
Influenza A: NOT DETECTED
Influenza B: NOT DETECTED
Metapneumovirus: NOT DETECTED
Respiratory Syncytial Virus A: NOT DETECTED
Respiratory Syncytial Virus B: NOT DETECTED
Rhinovirus: DETECTED — AB

## 2021-08-04 NOTE — Patient Instructions (Signed)
Tessalon Perles 100 mg 3 times daily as needed for cough.  Take Zithromax Z-PAK 2 tabs day 1 followed by 1 tab days 2 through 5.  Hycodan cough syrup 1 teaspoon every 8 hours if needed for severe coughing.  Rest and drink plenty of fluids.  Addendum: Respiratory virus panel positive for rhinovirus which is the ordinary cold virus

## 2021-08-13 DIAGNOSIS — F4389 Other reactions to severe stress: Secondary | ICD-10-CM | POA: Diagnosis not present

## 2021-08-15 ENCOUNTER — Other Ambulatory Visit: Payer: PPO | Admitting: Internal Medicine

## 2021-08-15 ENCOUNTER — Other Ambulatory Visit: Payer: Self-pay

## 2021-08-15 DIAGNOSIS — Z0000 Encounter for general adult medical examination without abnormal findings: Secondary | ICD-10-CM

## 2021-08-15 DIAGNOSIS — E7800 Pure hypercholesterolemia, unspecified: Secondary | ICD-10-CM | POA: Diagnosis not present

## 2021-08-15 DIAGNOSIS — E559 Vitamin D deficiency, unspecified: Secondary | ICD-10-CM | POA: Diagnosis not present

## 2021-08-15 DIAGNOSIS — Z1329 Encounter for screening for other suspected endocrine disorder: Secondary | ICD-10-CM

## 2021-08-15 DIAGNOSIS — M8580 Other specified disorders of bone density and structure, unspecified site: Secondary | ICD-10-CM | POA: Diagnosis not present

## 2021-08-16 LAB — CBC WITH DIFFERENTIAL/PLATELET
Absolute Monocytes: 440 cells/uL (ref 200–950)
Basophils Absolute: 70 cells/uL (ref 0–200)
Basophils Relative: 1.6 %
Eosinophils Absolute: 158 cells/uL (ref 15–500)
Eosinophils Relative: 3.6 %
HCT: 39.8 % (ref 35.0–45.0)
Hemoglobin: 13.8 g/dL (ref 11.7–15.5)
Lymphs Abs: 1188 cells/uL (ref 850–3900)
MCH: 33.3 pg — ABNORMAL HIGH (ref 27.0–33.0)
MCHC: 34.7 g/dL (ref 32.0–36.0)
MCV: 95.9 fL (ref 80.0–100.0)
MPV: 9.6 fL (ref 7.5–12.5)
Monocytes Relative: 10 %
Neutro Abs: 2543 cells/uL (ref 1500–7800)
Neutrophils Relative %: 57.8 %
Platelets: 327 10*3/uL (ref 140–400)
RBC: 4.15 10*6/uL (ref 3.80–5.10)
RDW: 11.4 % (ref 11.0–15.0)
Total Lymphocyte: 27 %
WBC: 4.4 10*3/uL (ref 3.8–10.8)

## 2021-08-16 LAB — VITAMIN D 25 HYDROXY (VIT D DEFICIENCY, FRACTURES): Vit D, 25-Hydroxy: 33 ng/mL (ref 30–100)

## 2021-08-16 LAB — LIPID PANEL
Cholesterol: 215 mg/dL — ABNORMAL HIGH (ref <200)
HDL: 86 mg/dL (ref >=50)
LDL Cholesterol (Calc): 108 mg/dL (calc) — ABNORMAL HIGH
Non-HDL Cholesterol (Calc): 129 mg/dL (calc) (ref <130)
Total CHOL/HDL Ratio: 2.5 (calc) (ref <5.0)
Triglycerides: 110 mg/dL (ref <150)

## 2021-08-16 LAB — COMPLETE METABOLIC PANEL WITH GFR
AG Ratio: 1.4 (calc) (ref 1.0–2.5)
ALT: 10 U/L (ref 6–29)
AST: 14 U/L (ref 10–35)
Albumin: 4 g/dL (ref 3.6–5.1)
Alkaline phosphatase (APISO): 33 U/L — ABNORMAL LOW (ref 37–153)
BUN: 16 mg/dL (ref 7–25)
CO2: 29 mmol/L (ref 20–32)
Calcium: 9.6 mg/dL (ref 8.6–10.4)
Chloride: 101 mmol/L (ref 98–110)
Creat: 0.52 mg/dL (ref 0.50–1.05)
Globulin: 2.9 g/dL (calc) (ref 1.9–3.7)
Glucose, Bld: 90 mg/dL (ref 65–99)
Potassium: 5.3 mmol/L (ref 3.5–5.3)
Sodium: 138 mmol/L (ref 135–146)
Total Bilirubin: 0.6 mg/dL (ref 0.2–1.2)
Total Protein: 6.9 g/dL (ref 6.1–8.1)
eGFR: 101 mL/min/{1.73_m2} (ref >=60)

## 2021-08-16 LAB — TSH: TSH: 2.62 mIU/L (ref 0.40–4.50)

## 2021-08-22 ENCOUNTER — Other Ambulatory Visit: Payer: PPO | Admitting: Internal Medicine

## 2021-08-26 ENCOUNTER — Telehealth: Payer: Self-pay | Admitting: Internal Medicine

## 2021-08-26 ENCOUNTER — Ambulatory Visit: Payer: PPO | Admitting: Internal Medicine

## 2021-08-26 NOTE — Telephone Encounter (Signed)
Patient has to cancel her CPE this week as her dear friend lost husband in tragic car accident. However, she wants to get flu vaccine Wednesday at 9:30 am. We will not have any CPEs available for the rest of the year.

## 2021-08-26 NOTE — Telephone Encounter (Signed)
Canceled cpe

## 2021-08-28 ENCOUNTER — Other Ambulatory Visit: Payer: Self-pay

## 2021-08-28 ENCOUNTER — Encounter: Payer: Self-pay | Admitting: Internal Medicine

## 2021-08-28 ENCOUNTER — Ambulatory Visit (INDEPENDENT_AMBULATORY_CARE_PROVIDER_SITE_OTHER): Payer: PPO | Admitting: Internal Medicine

## 2021-08-28 DIAGNOSIS — Z23 Encounter for immunization: Secondary | ICD-10-CM | POA: Diagnosis not present

## 2021-08-28 DIAGNOSIS — F4389 Other reactions to severe stress: Secondary | ICD-10-CM | POA: Diagnosis not present

## 2021-08-28 NOTE — Progress Notes (Signed)
Flu vaccine given by CMA. MJB, MD

## 2021-09-04 ENCOUNTER — Ambulatory Visit (INDEPENDENT_AMBULATORY_CARE_PROVIDER_SITE_OTHER): Payer: PPO

## 2021-09-04 DIAGNOSIS — Z0000 Encounter for general adult medical examination without abnormal findings: Secondary | ICD-10-CM

## 2021-09-04 NOTE — Progress Notes (Deleted)
IElby Showers, MD, have reviewed all documentation for this visit. The documentation on 09/04/21 for the exam, diagnosis, procedures, and orders are all accurate and complete.

## 2021-09-04 NOTE — Progress Notes (Addendum)
I connected with  Fredderick Phenix on 09/04/21 by a audio enabled telemedicine application and verified that I am speaking with the correct person using two identifiers.  Patient Location: Home  Provider Location: Office/Clinic  I discussed the limitations of evaluation and management by telemedicine. The patient expressed understanding and agreed to proceed.  Subjective:   Gabriela Green is a 70 y.o. female who presents for Medicare Annual (Subsequent) preventive examination.  Review of Systems    Defer to PCP Cardiac Risk Factors include: advanced age (>75men, >38 women)     Objective:    There were no vitals filed for this visit. There is no height or weight on file to calculate BMI.  Advanced Directives 09/04/2021 01/21/2016  Does Patient Have a Medical Advance Directive? Yes Yes  Type of Paramedic of [place];Living will Living will  Does patient want to make changes to medical advance directive? No - Patient declined No - Patient declined  Copy of [place] in Chart? No - copy requested No - copy requested  Would patient like information on creating a medical advance directive? No - Patient declined -    Current Medications (verified) Outpatient Encounter Medications as of 09/04/2021  Medication Sig   benzonatate (TESSALON) 100 MG capsule Take 1 capsule (100 mg total) by mouth 3 (three) times daily as needed for cough.   bimatoprost (LATISSE) 0.03 % ophthalmic solution Place into both eyes nightly. Uses regularly   Biotin 5000 MCG TABS Take 1 tablet by mouth daily.   Cholecalciferol (VITAMIN D) 2000 units CAPS Take 1 capsule by mouth daily.   estradiol (ESTRACE) 1 MG tablet Take 1 tablet (1 mg total) by mouth daily.   hyoscyamine (LEVSIN SL) 0.125 MG SL tablet 1 tablet under tongue one half hour before meals and at bedtime as needed for irritable bowel syndrome   Hyoscyamine Sulfate SL (LEVSIN/SL) 0.125 MG SUBL 1 tablet under tongue  one half hour before meals and at bedtime as needed for irritable bowel syndrome   Misc Natural Products (PROGESTERONE) 1000 MG/60GM CREA Apply 1 application topically daily.   valACYclovir (VALTREX) 500 MG tablet TAKE 1 TABLET BY MOUTH TWICE A DAY AS DIRECTED   [DISCONTINUED] HYDROcodone bit-homatropine (HYCODAN) 5-1.5 MG/5ML syrup Take 5 mLs by mouth every 8 (eight) hours as needed for cough.   No facility-administered encounter medications on file as of 09/04/2021.    Allergies (verified) Latex   History: Past Medical History:  Diagnosis Date   Allergy    HSV-2 (herpes simplex virus 2) infection    Interstitial cystitis    Nephrolithiasis 06/2017   TMJ syndrome    Past Surgical History:  Procedure Laterality Date   ABDOMINAL HYSTERECTOMY     AUGMENTATION MAMMAPLASTY     replaced 2021   BREAST ENHANCEMENT SURGERY     BREAST ENHANCEMENT SURGERY Bilateral    had reduction and implants, then later implants removed and replaced   COLONOSCOPY  2009   MICRODISCECTOMY LUMBAR  10/1994   L5-S1   REDUCTION MAMMAPLASTY     Family History  Problem Relation Age of Onset   Hyperlipidemia Mother    Heart attack Father    Diabetes Father    Diabetes Sister    Heart disease Paternal Aunt    Breast cancer Maternal Grandmother    Colon cancer Maternal Grandmother 36   Ovarian cancer Other        mat great aunt   Esophageal cancer Neg Hx  Rectal cancer Neg Hx    Stomach cancer Neg Hx    Social History   Socioeconomic History   Marital status: Married    Spouse name: Not on file   Number of children: 2   Years of education: Not on file   Highest education level: Not on file  Occupational History   Occupation: Community education officer    Comment: Self-employed  Tobacco Use   Smoking status: Never   Smokeless tobacco: Never  Vaping Use   Vaping Use: Never used  Substance and Sexual Activity   Alcohol use: Yes    Alcohol/week: 14.0 standard drinks    Types: 14 Glasses of wine  per week   Drug use: No   Sexual activity: Yes    Birth control/protection: Post-menopausal  Other Topics Concern   Not on file  Social History Narrative   Patient is divorced, she has 2 daughters.  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  No alcohol tobacco or drug use   Some caffeine   08/20/2017   Social Determinants of Health   Financial Resource Strain: Low Risk    Difficulty of Paying Living Expenses: Not hard at all  Food Insecurity: No Food Insecurity   Worried About Charity fundraiser in the Last Year: Never true   Ran Out of Food in the Last Year: Never true  Transportation Needs: No Transportation Needs   Lack of Transportation (Medical): No   Lack of Transportation (Non-Medical): No  Physical Activity: Insufficiently Active   Days of Exercise per Week: 3 days   Minutes of Exercise per Session: 30 min  Stress: No Stress Concern Present   Feeling of Stress : Not at all  Social Connections: Moderately Integrated   Frequency of Communication with Friends and Family: More than three times a week   Frequency of Social Gatherings with Friends and Family: More than three times a week   Attends Religious Services: 1 to 4 times per year   Active Member of [employer] or Organizations: No   Attends Music therapist: Never   Marital Status: Married    Tobacco Counseling Counseling given: Not Answered   Clinical Intake:  Pre-visit preparation completed: Yes  Pain : No/denies pain     Nutritional Risks: None Diabetes: No  How often do you need to have someone help you when you read instructions, pamphlets, or other written materials from your doctor or pharmacy?: 1 - Never What is the last grade level you completed in school?: 4 year college  Diabetic?no  Interpreter Needed?: No      Activities of Daily Living In your present state of health, do you have any difficulty performing the following activities: 09/04/2021   Hearing? N  Vision? N  Difficulty concentrating or making decisions? N  Walking or climbing stairs? N  Dressing or bathing? N  Doing errands, shopping? N  Preparing Food and eating ? N  Using the Toilet? N  In the past six months, have you accidently leaked urine? N  Do you have problems with loss of bowel control? N  Managing your Medications? N  Managing your Finances? N  Housekeeping or managing your Housekeeping? N  Some recent data might be hidden    Patient Care Team: Baxley, Cresenciano Lick, MD as PCP - General (Internal Medicine)  Indicate any recent Medical Services you may have received from other than [facility] providers in the past year (date may be approximate).  Assessment:   This is a routine wellness examination for [place].  Hearing/Vision screen No results found.  Dietary issues and exercise activities discussed: Current Exercise Habits: The patient does not participate in regular exercise at present, Exercise limited by: None identified   Goals Addressed   None   Depression Screen PHQ 2/9 Scores 09/04/2021 09/04/2021 08/23/2019 08/17/2018 04/30/2018 04/20/2017 11/14/2012  PHQ - 2 Score 0 0 0 0 0 0 0    Fall Risk Fall Risk  09/04/2021 08/23/2019 08/02/2019 08/17/2018 04/30/2018  Falls in the past year? 0 0 0 0 No  Comment - - Emmi Telephone Survey: data to providers prior to load - -  Number falls in past yr: 0 - - - -  Injury with Fall? 0 - - - -  Risk for fall due to : No Fall Risks - - - -  Follow up Falls evaluation completed - - - -    FALL RISK PREVENTION PERTAINING TO THE HOME:  Any stairs in or around the home? Yes  If so, are there any without handrails? Yes  Home free of loose throw rugs in walkways, pet beds, electrical cords, etc? Yes  Adequate lighting in your home to reduce risk of falls? Yes   ASSISTIVE DEVICES UTILIZED TO PREVENT FALLS:  Life alert? No  Use of a cane, walker or w/c? No  Grab bars in the bathroom? No  Shower chair or bench  in shower? Yes  Elevated toilet seat or a handicapped toilet? No   TIMED UP AND GO:  Was the test performed? No .  Length of time to ambulate 10 feet: n/a sec.     Cognitive Function:     6CIT Screen 09/04/2021  What Year? 0 points  What month? 0 points  What time? 0 points  Count back from 20 0 points  Months in reverse 0 points  Repeat phrase 0 points  Total Score 0    Immunizations Immunization History  Administered Date(s) Administered   Influenza Whole 06/08/2010, 05/20/2011   Influenza,inj,Quad PF,6+ Mos 07/10/2015, 06/02/2017, 08/17/2018, 05/31/2019, 06/13/2020, 08/28/2021   Influenza,inj,quad, With Preservative 06/08/2018   PFIZER(Purple Top)SARS-COV-2 Vaccination 10/06/2019, 10/27/2019, 01/18/2021   Pneumococcal Conjugate-13 09/14/2018   Tdap 02/20/2003, 10/04/2013   Zoster, Live 04/13/2014    TDAP status: Up to date  Flu Vaccine status: Up to date  Pneumococcal vaccine status: Due, Education has been provided regarding the importance of this vaccine. Advised may receive this vaccine at local pharmacy or Health Dept. Aware to provide a copy of the vaccination record if obtained from local pharmacy or Health Dept. Verbalized acceptance and understanding.  Covid-19 vaccine status: Information provided on how to obtain vaccines.   Qualifies for Shingles Vaccine? Yes   Zostavax completed No   Shingrix Completed?: No.    Education has been provided regarding the importance of this vaccine. Patient has been advised to call insurance company to determine out of pocket expense if they have not yet received this vaccine. Advised may also receive vaccine at local pharmacy or Health Dept. Verbalized acceptance and understanding.  Screening Tests Health Maintenance  Topic Date Due   Hepatitis C Screening  Never done   Pneumonia Vaccine 64+ Years old (2 - PPSV23 if available, else PCV20) 09/15/2019   COVID-19 Vaccine (4 - Booster for [place] series) 09/20/2021  (Originally 03/15/2021)   Zoster Vaccines- Shingrix (1 of 2) 12/03/2021 (Originally 10/29/2002)   MAMMOGRAM  07/13/2023   TETANUS/TDAP  10/05/2023   COLONOSCOPY (Pts 45-66yrs  Insurance coverage will need to be confirmed)  05/13/2028   INFLUENZA VACCINE  Completed   DEXA SCAN  Completed   HPV VACCINES  Aged Out    Health Maintenance  Health Maintenance Due  Topic Date Due   Hepatitis C Screening  Never done   Pneumonia Vaccine 76+ Years old (2 - PPSV23 if available, else PCV20) 09/15/2019    Colorectal cancer screening: Type of screening: Colonoscopy. Completed 05/13/18. Repeat every 10 years  Mammogram status: Completed 07/12/21. Repeat every year  Bone Density status: Completed 03/01/2019. Results reflect: Bone density results: NORMAL. Repeat every 2 years.  Lung Cancer Screening: (Low Dose CT Chest recommended if Age 62-80 years, 30 pack-year currently smoking OR have quit w/in 15years.) does not qualify.   Lung Cancer Screening Referral: n/a  Additional Screening:  Hepatitis C Screening: does qualify; Completed not yet  Vision Screening: Recommended annual ophthalmology exams for early detection of glaucoma and other disorders of the eye. Is the patient up to date with their annual eye exam?  Yes  Who is the provider or what is the name of the office in which the patient attends annual eye exams? [facility] If pt is not established with a provider, would they like to be referred to a provider to establish care?  N/a .   Dental Screening: Recommended annual dental exams for proper oral hygiene  Community Resource Referral / Chronic Care Management: CRR required this visit?  No   CCM required this visit?  No      Plan:     I have personally reviewed and noted the following in the patients chart:   Medical and social history Use of alcohol, tobacco or illicit drugs  Current medications and supplements including opioid prescriptions.  Functional ability and  status Nutritional status Physical activity Advanced directives List of other physicians Hospitalizations, surgeries, and ER visits in previous 12 months Vitals Screenings to include cognitive, depression, and falls Referrals and appointments  In addition, I have reviewed and discussed with patient certain preventive protocols, quality metrics, and best practice recommendations. A written personalized care plan for preventive services as well as general preventive health recommendations were provided to patient.     Angus Seller, CMA   09/04/2021   Nurse Notes: Non face to face 17 minutes.  Ms. Cowher , Thank you for taking time to come for your Medicare Wellness Visit. I appreciate your ongoing commitment to your health goals. Please review the following plan we discussed and let me know if I can assist you in the future.   These are the goals we discussed:  Goals   None     This is a list of the screening recommended for you and due dates:  Health Maintenance  Topic Date Due   Hepatitis C Screening: USPSTF Recommendation to screen - Ages 67-79 yo.  Never done   Pneumonia Vaccine (2 - PPSV23 if available, else PCV20) 09/15/2019   COVID-19 Vaccine (4 - Booster for Pfizer series) 09/20/2021*   Zoster (Shingles) Vaccine (1 of 2) 12/03/2021*   Mammogram  07/13/2023   Tetanus Vaccine  10/05/2023   Colon Cancer Screening  05/13/2028   Flu Shot  Completed   DEXA scan (bone density measurement)  Completed   HPV Vaccine  Aged Out  *Topic was postponed. The date shown is not the original due date.    IElby Showers, MD, have reviewed all documentation for this visit. The documentation on 09/04/21 for the  exam, diagnosis, procedures, and orders are all accurate and complete.

## 2021-09-05 ENCOUNTER — Ambulatory Visit: Payer: PPO

## 2021-09-23 DIAGNOSIS — F4389 Other reactions to severe stress: Secondary | ICD-10-CM | POA: Diagnosis not present

## 2021-10-01 DIAGNOSIS — F4389 Other reactions to severe stress: Secondary | ICD-10-CM | POA: Diagnosis not present

## 2021-10-18 ENCOUNTER — Encounter: Payer: Self-pay | Admitting: Internal Medicine

## 2021-10-18 ENCOUNTER — Ambulatory Visit (INDEPENDENT_AMBULATORY_CARE_PROVIDER_SITE_OTHER): Payer: PPO | Admitting: Internal Medicine

## 2021-10-18 ENCOUNTER — Other Ambulatory Visit: Payer: Self-pay

## 2021-10-18 VITALS — BP 120/78 | HR 77 | Temp 97.6°F | Wt 141.5 lb

## 2021-10-18 DIAGNOSIS — Z1211 Encounter for screening for malignant neoplasm of colon: Secondary | ICD-10-CM

## 2021-10-18 DIAGNOSIS — K580 Irritable bowel syndrome with diarrhea: Secondary | ICD-10-CM

## 2021-10-18 DIAGNOSIS — Z1213 Encounter for screening for malignant neoplasm of small intestine: Secondary | ICD-10-CM

## 2021-10-18 NOTE — Progress Notes (Signed)
° ° ° ° °  Subjective:    Patient ID: Gabriela Green, female    DOB: 08/25/1951, 71 y.o.   MRN: [id number]  HPI 71 year old Female seen with longstanding history of irritable bowel symptoms. Not always sure what foods will trigger diarrhea and bloating. Seems a bit worse recently. Not usually related to dairy products but could perhaps also have an element of lactose intolerance. Discussion today about foods that can trigger symptoms. Have given her Dr. Buel Ream handout previously with suggestions. There really is not a lot new in this area. Does not sound severe although can be inconvenient.  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 Sometimes has meetings with clients. Sometimes has situational stress.  Review of Systems no recent foreign travel     Objective:   Physical Exam  BP 120/78 T 97.6 ,pulse 77 regular   Abdominal exam: soft non-distended without organomegaly masses or tenderness.     Assessment & Plan:   Irritable bowel syndrome-D Plan: restart Levsin sublingual before meals as needed especially breakfast. Pt. Requests colon cancer screening with Cologard. This was ordered.Had colonoscopy 2019 by Dr. Carlean Purl with 10 year follow up recommended by GI.

## 2021-10-18 NOTE — Patient Instructions (Addendum)
Restart Levsin sublingual before meals.Cologard ordered at patient request.

## 2021-11-13 DIAGNOSIS — Z1211 Encounter for screening for malignant neoplasm of colon: Secondary | ICD-10-CM | POA: Diagnosis not present

## 2021-11-13 DIAGNOSIS — Z0289 Encounter for other administrative examinations: Secondary | ICD-10-CM

## 2021-11-15 DIAGNOSIS — F4389 Other reactions to severe stress: Secondary | ICD-10-CM | POA: Diagnosis not present

## 2021-11-21 DIAGNOSIS — H52223 Regular astigmatism, bilateral: Secondary | ICD-10-CM | POA: Diagnosis not present

## 2021-11-21 DIAGNOSIS — H04129 Dry eye syndrome of unspecified lacrimal gland: Secondary | ICD-10-CM | POA: Diagnosis not present

## 2021-11-21 DIAGNOSIS — H524 Presbyopia: Secondary | ICD-10-CM | POA: Diagnosis not present

## 2021-11-21 DIAGNOSIS — H04121 Dry eye syndrome of right lacrimal gland: Secondary | ICD-10-CM | POA: Diagnosis not present

## 2021-11-21 DIAGNOSIS — H5203 Hypermetropia, bilateral: Secondary | ICD-10-CM | POA: Diagnosis not present

## 2021-11-24 LAB — COLOGUARD: COLOGUARD: NEGATIVE

## 2021-12-04 DIAGNOSIS — F4389 Other reactions to severe stress: Secondary | ICD-10-CM | POA: Diagnosis not present

## 2021-12-06 ENCOUNTER — Ambulatory Visit (INDEPENDENT_AMBULATORY_CARE_PROVIDER_SITE_OTHER): Payer: PPO | Admitting: Internal Medicine

## 2021-12-06 ENCOUNTER — Telehealth: Payer: Self-pay | Admitting: Internal Medicine

## 2021-12-06 ENCOUNTER — Encounter: Payer: Self-pay | Admitting: Internal Medicine

## 2021-12-06 VITALS — BP 120/70 | HR 72 | Temp 97.6°F

## 2021-12-06 DIAGNOSIS — K580 Irritable bowel syndrome with diarrhea: Secondary | ICD-10-CM

## 2021-12-06 DIAGNOSIS — R110 Nausea: Secondary | ICD-10-CM | POA: Diagnosis not present

## 2021-12-06 MED ORDER — ONDANSETRON HCL 4 MG PO TABS: 4.0000 mg | ORAL_TABLET | Freq: Three times a day (TID) | ORAL | 0 refills | Status: DC | PRN

## 2021-12-06 NOTE — Progress Notes (Signed)
? ?  Subjective:  ? ? Patient ID: Gabriela Green, female    DOB: [birth year], 71 y.o.   MRN: [id number] ? ?HPI Awakened this morning, had coffee on empty stomach and subsequently felt nauseated. Had some vague abdominal discomfort last night. Had some lose bowel movements. No travel history. Did not eat anything unusual. No travel history. Was to have had busy day at work today but simply felt bad most of the day. Has work to do in [place] on Monday afternoon and needs to get ready for that. Felt nauseated most of the day. ? ?Has had vague nausea previously at times. ?Hx of IBS with diarrhea.Have prescribed Levsin previously. In mid Feb, had discussion about IBS-D. ? ?Recently requested Cologard which was normal. Had undergone colonoscopy by Dr. Carlean Purl that was normal in 2019. Labs in December 2022 were normal. ? ? ? ?Review of Systems other than work, no significant stress identified. Non-smoker. Social alcohol consumption.  Says she feels better this afternoon than she did this morning. ? ?   ?Objective:  ? Physical Exam ?  ?She is afebrile.Abdomen is soft nondistended without hepatosplenomegaly masses or tenderness.  She has no scleral icterus. ? ? ?   ?Assessment & Plan:  ?Nausea-etiology unclear.  Perhaps would be wise to screen for occult gallbladder disease with gallbladder ultrasound or hepatobiliary scan.  Currently has no jaundice. ? ??  Irritable bowel syndrome with diarrhea ? ?Plan: Have asked patient to see gastroenterologist regarding the symptoms.  Prescribed Zofran 4 mg tablets if needed for nausea. ? ?

## 2021-12-06 NOTE — Telephone Encounter (Signed)
Gabriela Green  ?[phone] ? ?Gabriela Green called to say that Gabriela Green has been severely nauseous since this morning. Nothing new that would cause this that she knows of. No fever. ?

## 2021-12-06 NOTE — Telephone Encounter (Signed)
Scheduled office appointment after speaking with Dr Renold Genta ?

## 2021-12-06 NOTE — Patient Instructions (Signed)
Patient has Zofran 4 mg tablets on hand if needed for nausea.  Recommend not drinking coffee on an empty stomach.  Suggest referral to gastroenterologist for evaluation.  History of normal colonoscopy.  No history of elevated liver functions.  They want to consider gallbladder ultrasound. ?

## 2021-12-09 NOTE — Telephone Encounter (Signed)
Called Roisin to give her phone number to schedule US Abdomen and she was having a really bad episode of nausea this morning. ?

## 2021-12-09 NOTE — Telephone Encounter (Signed)
Called and scheduled appointment for 12/26/2021, they will also put her on a wait list ?

## 2021-12-10 ENCOUNTER — Ambulatory Visit
Admission: RE | Admit: 2021-12-10 | Discharge: 2021-12-10 | Disposition: A | Discharge status: Home / Self Care | Payer: PPO | Source: Ambulatory Visit | Attending: Internal Medicine | Admitting: Internal Medicine

## 2021-12-10 DIAGNOSIS — K7689 Other specified diseases of liver: Secondary | ICD-10-CM | POA: Diagnosis not present

## 2021-12-10 DIAGNOSIS — R110 Nausea: Secondary | ICD-10-CM | POA: Diagnosis not present

## 2021-12-11 ENCOUNTER — Telehealth: Payer: Self-pay

## 2021-12-11 ENCOUNTER — Encounter: Payer: Self-pay | Admitting: Internal Medicine

## 2021-12-11 NOTE — Telephone Encounter (Signed)
-----   Message from Gatha Mayer, MD sent at 12/11/2021  1:02 PM EDT ----- ?Regarding: Appt ?Remo Lipps, ? ?Please give her one of my open banding slots next week and cancel APP appt ? ?CEG ?----- Message ----- ?From: Ladene Artist, MD ?Sent: 12/11/2021  11:04 AM EDT ?To: Gatha Mayer, MD, Gillermina Hu, RN ? ?CG, ?Korbin Notaro is your patient and Peggie's husband, my friend Andee Lineman, relayed to me that she is having bouts of abdominal pain and nausea. Apparently they have an appt with JL on 4/20 and wanted a sooner appt and possibly mgmt advice ahead of appt.  ?Thanks,  ?MS ? ? ?

## 2021-12-11 NOTE — Telephone Encounter (Signed)
Pt was scheduled for 12/17/2021 at 3:10 to see Dr. Carlean Purl: Pt made aware: ?Pt verbalized understanding with all questions answered.  ?Appointment with App canceled: Pt made aware  ? ?

## 2021-12-12 ENCOUNTER — Encounter: Payer: Self-pay | Admitting: Internal Medicine

## 2021-12-17 ENCOUNTER — Encounter: Payer: Self-pay | Admitting: Internal Medicine

## 2021-12-17 ENCOUNTER — Ambulatory Visit: Payer: PPO | Admitting: Internal Medicine

## 2021-12-17 ENCOUNTER — Other Ambulatory Visit: Payer: PPO

## 2021-12-17 VITALS — BP 106/60 | HR 84 | Ht 67.0 in | Wt 140.6 lb

## 2021-12-17 DIAGNOSIS — K7689 Other specified diseases of liver: Secondary | ICD-10-CM | POA: Diagnosis not present

## 2021-12-17 DIAGNOSIS — R110 Nausea: Secondary | ICD-10-CM

## 2021-12-17 DIAGNOSIS — K580 Irritable bowel syndrome with diarrhea: Secondary | ICD-10-CM | POA: Diagnosis not present

## 2021-12-17 NOTE — Patient Instructions (Addendum)
Try these breathing exercises if nausea returns. ? ?Box breathing to relax and reduce anxiety ? ?Inhale to a count of 4; hold that 4 count; exhale over 4 count; hold exhale x 4 ? ?Then repeat this - a few times can hold x 6 count also ? ? ? ?Your provider has requested that you go to the basement level for lab work before leaving today. Press "B" on the elevator. The lab is located at the first door on the left as you exit the elevator. ? ?Due to recent changes in healthcare laws, you may see the results of your imaging and laboratory studies on MyChart before your provider has had a chance to review them.  We understand that in some cases there may be results that are confusing or concerning to you. Not all laboratory results come back in the same time frame and the provider may be waiting for multiple results in order to interpret others.  Please give [place] 48 hours in order for your provider to thoroughly review all the results before contacting the office for clarification of your results.  ? ?I appreciate the opportunity to care for you. ?Silvano Rusk, MD, [facility] ?

## 2021-12-17 NOTE — Progress Notes (Signed)
? ?Gabriela Green 71 y.o. [birth year] [id number] ? ?Assessment & Plan:  ? ?Encounter Diagnoses  ?Name Primary?  ? Nausea without vomiting Yes  ? Irritable bowel syndrome with diarrhea   ? Hepatic cyst   ? ? ?Etiology of this is not clear at this time.  She has a long history of IBS question if it is a manifestation of that.  It could be some sort of stress reaction.  She seems to be improving of late. ? ?Will evaluate for celiac disease, I do not think that is ever been done.  TTG antibody and IgA level. ? ?Continue as needed hyoscyamine and ondansetron. ? ?Observe for clinical course and any other symptoms.  Consider EGD, if celiac testing is positive would do an EGD with duodenal biopsies.  Right now she is improving and there are no worrisome or alarm features so we will hold off on any other imaging or endoscopy or other testing.  She was reassured about the hepatic cyst. ? ?I have suggested she try a box breathing technique when she has 1 of these episodes to see if this will mitigate her nausea, I think that if this is some sort of anxiety or stress reaction that may be useful.  Regardless it is harmless and if it works that will be great.  She is open to this idea. ? ?She will contact me in the interim and follow-up be determined pending the results and clinical course.  Right now will be as needed. ? ?I appreciate the opportunity to care for this patient. ?CC: Baxley, Cresenciano Lick, MD ? ? ?Subjective:  ? ?Chief Complaint: Nausea ? ?HPI ?71 year old white woman with 2-1/2 to 3 months of episodic nausea.  No vomiting.  Sometimes she has a sense that she needs to defecate when this occurs but it does not happen.  She has been seeing Dr. Renold Genta.  The symptoms are described as sometimes self-limited for about 15 minutes, often in the mid morning after she goes to work, and sometimes can be severe and make her want to go to bed for hours.  She has a history of IBS mostly diarrhea predominant and stools can be anywhere  from firm to almost greasy or liquid.  No rhyme or reason or triggers.  She has tried reducing coffee and dairy.  She does not really use artificial sweeteners.  She has not noticed improvement with changes in diet.  1 or 2 glasses of wine at night.  This is stable and not a change.  No new medications.  She has had ondansetron prescribed and is used that a couple of times with success.  One of her coworkers raise the question if this could be a stress reaction.  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  Lately she has been improving and she has had 2 very good days of late and she does have days without symptoms.  This does not disturb sleep though sometimes when she is sleeping she will awaken and have a lower abdominal or pelvic heaviness.  She does not have any significant urinary incontinence or urinary issues.  She got married in the fall and that is going well.  No weight loss as below.  No heartburn no indigestion no early satiety symptomatology.  She does use hyoscyamine as needed occasionally mainly as a prophylactic agent to prevent any type of postprandial urgent  stools which can be an issue at times. ? ?She had an ultrasound which showed a very small liver cyst but normal gallbladder, right upper quadrant ultrasound.  In December TSH CMET CBC normal.  She was concerned because she had a grandmother with colon cancer and even though she had a colonoscopy in 2019 with diverticulosis only she asked for and had a Cologuard performed that was negative. ? ?Wt Readings from Last 3 Encounters:  ?12/17/21 140 lb 9.6 oz (63.8 kg)  ?10/18/21 141 lb 8 oz (64.2 kg)  ?08/23/20 142 lb (64.4 kg)  ? ? ?Allergies  ?Allergen Reactions  ? Latex Swelling  ? ?Current Meds  ?Medication Sig  ? Biotin 5000 MCG TABS Take 1 tablet by mouth daily.  ? Cholecalciferol (VITAMIN D) 2000 units CAPS Take 1 capsule by mouth daily.  ? estradiol  (ESTRACE) 1 MG tablet Take 1 tablet (1 mg total) by mouth daily.  ? hyoscyamine (LEVSIN SL) 0.125 MG SL tablet 1 tablet under tongue one half hour before meals and at bedtime as needed for irritable bowel syndrome  ? Misc Natural Products (PROGESTERONE) 1000 MG/60GM CREA Apply 1 application topically daily.  ? ondansetron (ZOFRAN) 4 MG tablet Take 1 tablet (4 mg total) by mouth every 8 (eight) hours as needed for nausea or vomiting.  ? valACYclovir (VALTREX) 500 MG tablet TAKE 1 TABLET BY MOUTH TWICE A DAY AS DIRECTED  ? ?Past Medical History:  ?Diagnosis Date  ? Allergy   ? HSV-2 (herpes simplex virus 2) infection   ? IBS (irritable bowel syndrome)   ? Interstitial cystitis   ? Nephrolithiasis 06/2017  ? TMJ syndrome   ? ?Past Surgical History:  ?Procedure Laterality Date  ? ABDOMINAL HYSTERECTOMY    ? AUGMENTATION MAMMAPLASTY    ? replaced 2021  ? BREAST ENHANCEMENT SURGERY    ? BREAST ENHANCEMENT SURGERY Bilateral   ? had reduction and implants, then later implants removed and replaced  ? COLONOSCOPY  2009  ? MICRODISCECTOMY LUMBAR  10/1994  ? L5-S1  ? REDUCTION MAMMAPLASTY    ? ?Social History  ? ?Social History Narrative  ? Patient is divorced, remarried fall 2022.  She has 2 daughters.  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  No  tobacco or drug use alcohol 1 to 2 glasses of wine in the evenings  ? Some caffeine  ? ?family history includes Breast cancer in her maternal grandmother; Colon cancer (age of onset: 27) in her maternal grandmother; Diabetes in her father and sister; Heart attack in her father; Heart disease in her paternal aunt; Hyperlipidemia in her mother; Ovarian cancer in an other family member. ? ? ?Review of Systems ?As per HPI ? ?Objective:  ? Physical Exam ?'@BP'$  106/60   Pulse 84   Ht '5\' 7"'$  (1.702 m)   Wt 140 lb 9.6 oz (63.8 kg)   BMI 22.02 kg/m? @ ? ?General:  NAD ?Eyes:   anicteric ?Lungs:  clear ?Heart::  S1S2 no rubs, murmurs or gallops ?Abdomen:  soft  and nontender,  ?Neuro/psych: Calm appropriate mood and affect alert and oriented x3 ? ? ? ?Data Reviewed:  ?See HPI ? ?

## 2021-12-18 DIAGNOSIS — F4389 Other reactions to severe stress: Secondary | ICD-10-CM | POA: Diagnosis not present

## 2021-12-18 LAB — TISSUE TRANSGLUTAMINASE, IGA: (tTG) Ab, IgA: <1 U/mL

## 2021-12-18 LAB — IGA: Immunoglobulin A: 235 mg/dL (ref 70–320)

## 2021-12-21 ENCOUNTER — Other Ambulatory Visit: Payer: Self-pay | Admitting: Internal Medicine

## 2021-12-21 DIAGNOSIS — Z0000 Encounter for general adult medical examination without abnormal findings: Secondary | ICD-10-CM

## 2021-12-26 ENCOUNTER — Ambulatory Visit: Payer: PPO | Admitting: Physician Assistant

## 2022-01-07 DIAGNOSIS — F4389 Other reactions to severe stress: Secondary | ICD-10-CM | POA: Diagnosis not present

## 2022-01-29 DIAGNOSIS — F4389 Other reactions to severe stress: Secondary | ICD-10-CM | POA: Diagnosis not present

## 2022-03-27 DIAGNOSIS — F4389 Other reactions to severe stress: Secondary | ICD-10-CM | POA: Diagnosis not present

## 2022-04-22 ENCOUNTER — Encounter: Payer: Self-pay | Admitting: Internal Medicine

## 2022-04-22 ENCOUNTER — Ambulatory Visit (INDEPENDENT_AMBULATORY_CARE_PROVIDER_SITE_OTHER): Payer: PPO | Admitting: Internal Medicine

## 2022-04-22 VITALS — BP 104/66 | HR 85 | Temp 98.8°F

## 2022-04-22 DIAGNOSIS — J069 Acute upper respiratory infection, unspecified: Secondary | ICD-10-CM

## 2022-04-22 MED ORDER — FLUCONAZOLE 150 MG PO TABS: 150.0000 mg | ORAL_TABLET | Freq: Once | ORAL | 1 refills | Status: AC

## 2022-04-22 MED ORDER — AZITHROMYCIN 250 MG PO TABS: ORAL_TABLET | ORAL | 1 refills | Status: AC

## 2022-04-22 MED ORDER — BENZONATATE 100 MG PO CAPS: 100.0000 mg | ORAL_CAPSULE | Freq: Two times a day (BID) | ORAL | 1 refills | Status: DC | PRN

## 2022-04-22 NOTE — Progress Notes (Signed)
   Subjective:    Patient ID: Gabriela Green, female    DOB: Sep 14, 1950, 71 y.o.   MRN: [id number]  HPI Patient had onset of cough without fever this past weekend. Has taken 3 Covid tests on separate days  all of which have been negative.Does not really have malaise and fatigue. Just cough that will not go away. Has not been around anyone that she knows is ill.    Review of Systems no headache, fever chills, nausea, vomiting, or diarrhea     Objective:   Physical Exam   BP 104/66 pulse 85 pulse ox 98.8 degrees pulse ox 98% Skin: Warm and dry.  No cervical adenopathy.  TMs are clear.  Pharynx is clear.  Neck is supple.  Chest is clear to auscultation without rales or wheezing.     Assessment & Plan:   Acute upper respiratory infection  Cough  Plan: Tessalon Perles 100 mg twice daily as needed for cough.  Zithromax Z-PAK 2 tabs day 1 followed by 1 tab days 2 through 5.  Rest and drink fluids.  Call if not improving in 2 to 3 days or sooner if worse.  May take Diflucan if needed for Candida vaginitis while on antibiotics.

## 2022-05-01 DIAGNOSIS — M79642 Pain in left hand: Secondary | ICD-10-CM | POA: Diagnosis not present

## 2022-05-01 DIAGNOSIS — M720 Palmar fascial fibromatosis [Dupuytren]: Secondary | ICD-10-CM | POA: Diagnosis not present

## 2022-05-01 DIAGNOSIS — M79641 Pain in right hand: Secondary | ICD-10-CM | POA: Diagnosis not present

## 2022-05-24 NOTE — Patient Instructions (Addendum)
You have been diagnosed with an acute upper respiratory infection.  Take Zithromax Z-PAK 2 tabs day 1 followed by 1 tab days 2 through 5.  Tessalon Perles 100 mg twice daily as needed for cough.  Rest and drink fluids and call if not improving in 2 to 3 days or sooner if worse.  We are sorry you are not feeling well today.  It is always a pleasure to see you.  May take Diflucan if needed for Candida vaginitis while on antibiotics.

## 2022-06-09 ENCOUNTER — Ambulatory Visit: Payer: PPO

## 2022-06-10 ENCOUNTER — Ambulatory Visit (INDEPENDENT_AMBULATORY_CARE_PROVIDER_SITE_OTHER): Payer: PPO

## 2022-06-10 VITALS — BP 106/68 | Temp 97.9°F

## 2022-06-10 DIAGNOSIS — Z23 Encounter for immunization: Secondary | ICD-10-CM

## 2022-07-29 DIAGNOSIS — Z90711 Acquired absence of uterus with remaining cervical stump: Secondary | ICD-10-CM | POA: Diagnosis not present

## 2022-07-29 DIAGNOSIS — N8189 Other female genital prolapse: Secondary | ICD-10-CM | POA: Diagnosis not present

## 2022-07-29 DIAGNOSIS — Z1211 Encounter for screening for malignant neoplasm of colon: Secondary | ICD-10-CM | POA: Diagnosis not present

## 2022-07-29 DIAGNOSIS — Z1239 Encounter for other screening for malignant neoplasm of breast: Secondary | ICD-10-CM | POA: Diagnosis not present

## 2022-07-29 DIAGNOSIS — Z6822 Body mass index (BMI) 22.0-22.9, adult: Secondary | ICD-10-CM | POA: Diagnosis not present

## 2022-07-29 DIAGNOSIS — Z13820 Encounter for screening for osteoporosis: Secondary | ICD-10-CM | POA: Diagnosis not present

## 2022-07-30 ENCOUNTER — Other Ambulatory Visit: Payer: Self-pay | Admitting: Obstetrics and Gynecology

## 2022-07-30 DIAGNOSIS — Z13820 Encounter for screening for osteoporosis: Secondary | ICD-10-CM

## 2022-08-28 ENCOUNTER — Telehealth: Payer: Self-pay | Admitting: Internal Medicine

## 2022-08-28 NOTE — Telephone Encounter (Signed)
Gabriela Green [phone]  [name] called to say she called to schedule her mammogram, however when she told them she was having left side soreness. So they told her she would need a diagnostic mammogram. I let her know she may need a office visit to be seen to be evaluated.

## 2022-08-29 ENCOUNTER — Other Ambulatory Visit: Payer: Self-pay

## 2022-08-29 DIAGNOSIS — N644 Mastodynia: Secondary | ICD-10-CM

## 2022-08-29 DIAGNOSIS — Z1239 Encounter for other screening for malignant neoplasm of breast: Secondary | ICD-10-CM

## 2022-08-29 NOTE — Telephone Encounter (Signed)
Order was placed and patient was notified

## 2022-09-04 NOTE — Telephone Encounter (Signed)
When patient called to schedule the diagnostic mammogram, they said it needed to be for both breasts since she is behind, and she also said they mentioned to go ahead and put in an ultra sound just incase they found something since she is having soreness.

## 2022-09-04 NOTE — Telephone Encounter (Signed)
Noted  

## 2022-09-04 NOTE — Addendum Note (Signed)
Addended by: Geradine Girt D on: 09/04/2022 04:54 PM   Modules accepted: Orders

## 2022-09-09 ENCOUNTER — Other Ambulatory Visit: Payer: Self-pay | Admitting: Internal Medicine

## 2022-09-09 DIAGNOSIS — N644 Mastodynia: Secondary | ICD-10-CM

## 2022-09-10 NOTE — Progress Notes (Signed)
Spoke with patient and Isabel Caprice from the [facility] and confirmed what orders needed to be ordered and asked if she would call patient to schedule appt

## 2022-09-10 NOTE — Addendum Note (Signed)
Addended by: Geradine Girt D on: 09/10/2022 12:29 PM   Modules accepted: Orders

## 2022-09-24 ENCOUNTER — Other Ambulatory Visit: Payer: Self-pay | Admitting: Internal Medicine

## 2022-09-24 ENCOUNTER — Ambulatory Visit
Admission: RE | Admit: 2022-09-24 | Discharge: 2022-09-24 | Disposition: A | Discharge status: Home / Self Care | Payer: PPO | Source: Ambulatory Visit | Attending: Internal Medicine | Admitting: Internal Medicine

## 2022-09-24 DIAGNOSIS — R928 Other abnormal and inconclusive findings on diagnostic imaging of breast: Secondary | ICD-10-CM

## 2022-09-24 DIAGNOSIS — N644 Mastodynia: Secondary | ICD-10-CM | POA: Diagnosis not present

## 2022-09-24 DIAGNOSIS — N6310 Unspecified lump in the right breast, unspecified quadrant: Secondary | ICD-10-CM

## 2022-09-30 ENCOUNTER — Ambulatory Visit
Admission: RE | Admit: 2022-09-30 | Discharge: 2022-09-30 | Disposition: A | Discharge status: Home / Self Care | Payer: PPO | Source: Ambulatory Visit | Attending: Internal Medicine | Admitting: Internal Medicine

## 2022-09-30 DIAGNOSIS — R928 Other abnormal and inconclusive findings on diagnostic imaging of breast: Secondary | ICD-10-CM

## 2022-09-30 DIAGNOSIS — N6313 Unspecified lump in the right breast, lower outer quadrant: Secondary | ICD-10-CM | POA: Diagnosis not present

## 2022-09-30 DIAGNOSIS — N6011 Diffuse cystic mastopathy of right breast: Secondary | ICD-10-CM | POA: Diagnosis not present

## 2022-09-30 DIAGNOSIS — N6310 Unspecified lump in the right breast, unspecified quadrant: Secondary | ICD-10-CM

## 2022-09-30 HISTORY — PX: BREAST BIOPSY: SHX20

## 2022-10-06 ENCOUNTER — Other Ambulatory Visit: Payer: PPO

## 2022-10-06 DIAGNOSIS — R5383 Other fatigue: Secondary | ICD-10-CM

## 2022-10-06 DIAGNOSIS — Z136 Encounter for screening for cardiovascular disorders: Secondary | ICD-10-CM

## 2022-10-06 DIAGNOSIS — Z13220 Encounter for screening for lipoid disorders: Secondary | ICD-10-CM

## 2022-10-06 NOTE — Addendum Note (Signed)
Addended by: Geradine Girt D on: 10/06/2022 04:38 PM   Modules accepted: Orders

## 2022-10-07 ENCOUNTER — Encounter: Payer: Self-pay | Admitting: Internal Medicine

## 2022-10-07 ENCOUNTER — Ambulatory Visit (INDEPENDENT_AMBULATORY_CARE_PROVIDER_SITE_OTHER): Payer: PPO | Admitting: Internal Medicine

## 2022-10-07 ENCOUNTER — Other Ambulatory Visit: Payer: PPO

## 2022-10-07 VITALS — BP 110/74 | HR 79 | Temp 98.3°F | Ht 67.0 in | Wt 142.0 lb

## 2022-10-07 DIAGNOSIS — R5383 Other fatigue: Secondary | ICD-10-CM

## 2022-10-07 DIAGNOSIS — Z136 Encounter for screening for cardiovascular disorders: Secondary | ICD-10-CM

## 2022-10-07 DIAGNOSIS — K580 Irritable bowel syndrome with diarrhea: Secondary | ICD-10-CM

## 2022-10-07 DIAGNOSIS — Z13220 Encounter for screening for lipoid disorders: Secondary | ICD-10-CM | POA: Diagnosis not present

## 2022-10-07 DIAGNOSIS — Z0000 Encounter for general adult medical examination without abnormal findings: Secondary | ICD-10-CM | POA: Diagnosis not present

## 2022-10-07 LAB — POCT URINALYSIS DIPSTICK
Bilirubin, UA: NEGATIVE
Blood, UA: NEGATIVE
Glucose, UA: NEGATIVE
Ketones, UA: NEGATIVE
Leukocytes, UA: NEGATIVE
Nitrite, UA: NEGATIVE
Protein, UA: NEGATIVE
Spec Grav, UA: 1.01 (ref 1.010–1.025)
Urobilinogen, UA: 0.2 E.U./dL
pH, UA: 7.5 (ref 5.0–8.0)

## 2022-10-07 NOTE — Progress Notes (Shared)
Annual Wellness Visit    Patient Care Team: Elby Showers, MD as PCP - General (Internal Medicine)  Visit Date: 10/07/22   No chief complaint on file.   Subjective:   Patient: Gabriela Green, Female    DOB: 11/22/1950, 72 y.o.   MRN: [id number]  Gabriela Green is a 72 y.o. Female who presents today for her Annual Wellness Visit. She has a history of HSV-2 infection, IBS, interstitial cystitis, nephrolithiasis, and TMJ syndrome.  History of HSV-2 infection. Flare-ups treated with Valtrex 500 mg twice daily as directed.  History of IBS treated with Levsin 0.125 mg 30 minutes before meals and at bedtime as needed.  History of Vitamin D deficiency treated with Vitamin D 2000 units daily.  History of interstitial cystitis  History of nephrolithiasis  History of TMJ syndrome  Pap Smear last completed 09/30/11 by Dr. Antonietta Barcelona.   Dexa scan last completed 03/01/19. Results showed the BMD measured at Forearm Radius 33% is 0.824 g/cm2 with a T-score of -0.7. This patient is considered NORMAL according to [facility]) criteria. Scheduled for repeat on 01/26/23.  Mammogram last completed on 09/24/22. Results for the left breast show no suspicious mass, distortion, or microcalcifications are identified to suggest presence of malignancy. Patient has a retropectoral saline implant in the left breast. Right breast showed symmetry in the lower central portion of the right breast is confirmed on spot compression views in the 6 o'clock region of the inframammary fold. The patient has retropectoral saline implant. Recommended repeat in 2026.  Colonoscopy last completed on 05/13/2018. Results showed moderate diverticulosis in sigmoid colon, otherwise normal on direct and retroflexion views. No specimens collected. Recommended repeat in 2029.  UTD on flu vaccines. Due for Covid-19 vaccine. Declines shingles vaccine.   Past Medical History:  Diagnosis Date   Allergy     HSV-2 (herpes simplex virus 2) infection    IBS (irritable bowel syndrome)    Interstitial cystitis    Nephrolithiasis 06/2017   TMJ syndrome      Family History  Problem Relation Age of Onset   Hyperlipidemia Mother    Heart attack Father    Diabetes Father    Diabetes Sister    Heart disease Paternal Aunt    Breast cancer Maternal Grandmother    Colon cancer Maternal Grandmother 56   Ovarian cancer Other        mat great aunt   Esophageal cancer Neg Hx    Rectal cancer Neg Hx    Stomach cancer Neg Hx      Social History   Social History Narrative   Patient is divorced, remarried fall 2022.  She has 2 daughters.  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  No  tobacco or drug use alcohol 1 to 2 glasses of wine in the evenings   Some caffeine     ROS    Objective:   Vitals: There were no vitals taken for this visit.  Physical Exam   Most recent functional status assessment:     No data to display         Most recent fall risk assessment:    06/10/2022   10:02 AM  [place] in the past year? 0  Number falls in past yr: 0  Injury with Fall? 0  Risk for fall due to : No Fall Risks  Follow up Falls evaluation completed  Most recent depression screenings:    06/10/2022   10:02 AM 09/04/2021    2:29 PM  PHQ 2/9 Scores  PHQ - 2 Score 0 0   Most recent cognitive screening:    09/04/2021    2:29 PM  6CIT Screen  What Year? 0 points  What month? 0 points  What time? 0 points  Count back from 20 0 points  Months in reverse 0 points  Repeat phrase 0 points  Total Score 0 points     Results:   Studies obtained and personally reviewed by me:  Imaging, colonoscopy, mammogram, bone density scan, echocardiogram, heart cath, stress test, CT calcium score, etc. ***   Labs:       Component Value Date/Time   NA 138 08/15/2021 1037   K 5.3 08/15/2021 1037   CL 101 08/15/2021 1037   CO2 29 08/15/2021 1037    GLUCOSE 90 08/15/2021 1037   BUN 16 08/15/2021 1037   CREATININE 0.52 08/15/2021 1037   CALCIUM 9.6 08/15/2021 1037   PROT 6.9 08/15/2021 1037   ALBUMIN 4.0 04/20/2017 1029   AST 14 08/15/2021 1037   ALT 10 08/15/2021 1037   ALKPHOS 34 04/20/2017 1029   BILITOT 0.6 08/15/2021 1037   GFRNONAA 97 08/21/2020 1001   GFRAA 112 08/21/2020 1001     Lab Results  Component Value Date   WBC 4.4 08/15/2021   HGB 13.8 08/15/2021   HCT 39.8 08/15/2021   MCV 95.9 08/15/2021   PLT 327 08/15/2021    Lab Results  Component Value Date   CHOL 215 (H) 08/15/2021   HDL 86 08/15/2021   LDLCALC 108 (H) 08/15/2021   TRIG 110 08/15/2021   CHOLHDL 2.5 08/15/2021    Lab Results  Component Value Date   HGBA1C 5.2 08/21/2020     Lab Results  Component Value Date   TSH 2.62 08/15/2021     No results found for: "PSA1", "PSA" *** delete for female pts  ***  Assessment & Plan:   ***      Annual wellness visit done today including the all of the following: Reviewed patient's Family Medical History Reviewed and updated list of patient's medical providers Assessment of cognitive impairment was done Assessed patient's functional ability Established a written schedule for health screening services Health Risk Assessent Completed and Reviewed  Discussed health benefits of physical activity, and encouraged her to engage in regular exercise appropriate for her age and condition.        I,Alexander Ruley,acting as a Education administrator for Elby Showers, MD.,have documented all relevant documentation on the behalf of Elby Showers, MD,as directed by  Elby Showers, MD while in the presence of Elby Showers, MD.   ***

## 2022-10-08 LAB — CBC WITH DIFFERENTIAL/PLATELET
Absolute Monocytes: 545 cells/uL (ref 200–950)
Basophils Absolute: 72 cells/uL (ref 0–200)
Basophils Relative: 1.6 %
Eosinophils Absolute: 180 cells/uL (ref 15–500)
Eosinophils Relative: 4 %
HCT: 40.6 % (ref 35.0–45.0)
Hemoglobin: 14.3 g/dL (ref 11.7–15.5)
Lymphs Abs: 1233 cells/uL (ref 850–3900)
MCH: 32.9 pg (ref 27.0–33.0)
MCHC: 35.2 g/dL (ref 32.0–36.0)
MCV: 93.5 fL (ref 80.0–100.0)
MPV: 9.7 fL (ref 7.5–12.5)
Monocytes Relative: 12.1 %
Neutro Abs: 2471 cells/uL (ref 1500–7800)
Neutrophils Relative %: 54.9 %
Platelets: 317 10*3/uL (ref 140–400)
RBC: 4.34 10*6/uL (ref 3.80–5.10)
RDW: 11.4 % (ref 11.0–15.0)
Total Lymphocyte: 27.4 %
WBC: 4.5 10*3/uL (ref 3.8–10.8)

## 2022-10-08 LAB — COMPLETE METABOLIC PANEL WITH GFR
AG Ratio: 1.3 (calc) (ref 1.0–2.5)
ALT: 10 U/L (ref 6–29)
AST: 11 U/L (ref 10–35)
Albumin: 4 g/dL (ref 3.6–5.1)
Alkaline phosphatase (APISO): 36 U/L — ABNORMAL LOW (ref 37–153)
BUN: 14 mg/dL (ref 7–25)
CO2: 29 mmol/L (ref 20–32)
Calcium: 9.5 mg/dL (ref 8.6–10.4)
Chloride: 102 mmol/L (ref 98–110)
Creat: 0.57 mg/dL (ref 0.50–1.05)
Globulin: 3.1 g/dL (calc) (ref 1.9–3.7)
Glucose, Bld: 89 mg/dL (ref 65–99)
Potassium: 5.1 mmol/L (ref 3.5–5.3)
Sodium: 136 mmol/L (ref 135–146)
Total Bilirubin: 0.5 mg/dL (ref 0.2–1.2)
Total Protein: 7.1 g/dL (ref 6.1–8.1)
eGFR: 98 mL/min/{1.73_m2} (ref >=60)

## 2022-10-08 LAB — LIPID PANEL
Cholesterol: 208 mg/dL — ABNORMAL HIGH (ref <200)
HDL: 87 mg/dL (ref >=50)
LDL Cholesterol (Calc): 99 mg/dL (calc)
Non-HDL Cholesterol (Calc): 121 mg/dL (calc) (ref <130)
Total CHOL/HDL Ratio: 2.4 (calc) (ref <5.0)
Triglycerides: 120 mg/dL (ref <150)

## 2022-10-08 LAB — TSH: TSH: 2.64 mIU/L (ref 0.40–4.50)

## 2022-10-08 NOTE — Patient Instructions (Addendum)
It was a pleasure to see you today.  Return in 1 year or as needed.  Vaccines discussed.  We are glad that your recent right breast biopsy is normal.  Colonoscopy is up-to-date.  May continue with Levsin as needed for irritable bowel syndrome.

## 2022-12-11 DIAGNOSIS — L570 Actinic keratosis: Secondary | ICD-10-CM | POA: Diagnosis not present

## 2022-12-24 DIAGNOSIS — L649 Androgenic alopecia, unspecified: Secondary | ICD-10-CM | POA: Diagnosis not present

## 2023-01-26 ENCOUNTER — Ambulatory Visit
Admission: RE | Admit: 2023-01-26 | Discharge: 2023-01-26 | Disposition: A | Discharge status: Home / Self Care | Payer: PPO | Source: Ambulatory Visit | Attending: Obstetrics and Gynecology | Admitting: Obstetrics and Gynecology

## 2023-01-26 ENCOUNTER — Encounter: Payer: Self-pay | Admitting: Internal Medicine

## 2023-01-26 DIAGNOSIS — N951 Menopausal and female climacteric states: Secondary | ICD-10-CM | POA: Diagnosis not present

## 2023-01-26 DIAGNOSIS — Z13820 Encounter for screening for osteoporosis: Secondary | ICD-10-CM

## 2023-01-26 DIAGNOSIS — M810 Age-related osteoporosis without current pathological fracture: Secondary | ICD-10-CM | POA: Diagnosis not present

## 2023-01-26 DIAGNOSIS — E2839 Other primary ovarian failure: Secondary | ICD-10-CM | POA: Diagnosis not present

## 2023-01-26 LAB — HM DEXA SCAN

## 2023-02-20 DIAGNOSIS — L649 Androgenic alopecia, unspecified: Secondary | ICD-10-CM | POA: Diagnosis not present

## 2023-04-20 ENCOUNTER — Encounter: Payer: Self-pay | Admitting: Internal Medicine

## 2023-04-20 ENCOUNTER — Telehealth (INDEPENDENT_AMBULATORY_CARE_PROVIDER_SITE_OTHER): Payer: PPO | Admitting: Internal Medicine

## 2023-04-20 ENCOUNTER — Telehealth: Payer: Self-pay | Admitting: Internal Medicine

## 2023-04-20 VITALS — Temp 97.1°F

## 2023-04-20 DIAGNOSIS — U071 COVID-19: Secondary | ICD-10-CM

## 2023-04-20 DIAGNOSIS — J0100 Acute maxillary sinusitis, unspecified: Secondary | ICD-10-CM

## 2023-04-20 MED ORDER — FLUCONAZOLE 150 MG PO TABS: 150.0000 mg | ORAL_TABLET | Freq: Once | ORAL | 0 refills | Status: AC

## 2023-04-20 MED ORDER — AZITHROMYCIN 250 MG PO TABS: ORAL_TABLET | ORAL | 0 refills | Status: AC

## 2023-04-20 NOTE — Progress Notes (Signed)
Patient Care Team: Margaree Mackintosh, MD as PCP - General (Internal Medicine)  I connected with Gabriela Green on 04/20/23 at 3:38 PM by video enabled telemedicine visit and verified that I am speaking with the correct person using two identifiers.   I discussed the limitations, risks, security and privacy concerns of performing an evaluation and management service by telemedicine and the availability of in-person appointments. I also discussed with the patient that there may be a patient responsible charge related to this service. The patient expressed understanding and agreed to proceed.   Other persons participating in the visit and their role in the encounter: Medical scribe, Carlena Bjornstad  Patient's location: Home  Provider's location: Clinic   I provided 20 minutes of my time including interviewing patient, chart review, medical decision making , providing medical advice to patient,and personally e-scribing medications.   Chief Complaint: +COVID-19 infection   Subjective:    Patient ID: Gabriela Green , Female    DOB: 1951/03/10, 72 y.o.    MRN: [id number]   72 y.o. Female presents today for video visit for acute positive COVID-19 viral infection. She called the office today reporting that she tested positive for COVID yesterday.  Today, she reports that her symptoms began last Friday evening with increased sinus pressure and dental pain. She tried taking an antihistamine for treatment. On Sunday she tested positive for COVID-19. She has felt slightly less energetic than her baseline. Today she took a nap for several hours which was unusual for her. She also complains of a lot of congestion with some green mucus. However, she has been waking up in the mornings with sinus congestion over the last couple of months. No fever or sore throat.   Past Medical History:  Diagnosis Date   Allergy    HSV-2 (herpes simplex virus 2) infection    IBS (irritable bowel syndrome)    Interstitial  cystitis    Nephrolithiasis 06/2017   TMJ syndrome      Family History  Problem Relation Age of Onset   Hyperlipidemia Mother    Heart attack Father    Diabetes Father    Diabetes Sister    Heart disease Paternal Aunt    Breast cancer Maternal Grandmother    Colon cancer Maternal Grandmother 19   Ovarian cancer Other        mat great aunt   Esophageal cancer Neg Hx    Rectal cancer Neg Hx    Stomach cancer Neg Hx     Social History   Social History Narrative   Patient is divorced, remarried fall 2022.  She has 2 daughters.  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  No  tobacco or drug use alcohol 1 to 2 glasses of wine in the evenings   Some caffeine     Review of Systems  Constitutional:  Positive for malaise/fatigue. Negative for fever.  HENT:  Positive for congestion. Negative for sore throat.   Eyes:  Negative for blurred vision.  Respiratory:  Negative for cough and shortness of breath.   Cardiovascular:  Negative for chest pain, palpitations and leg swelling.  Gastrointestinal:  Negative for vomiting.  Musculoskeletal:  Negative for back pain.  Skin:  Negative for rash.  Neurological:  Negative for loss of consciousness and headaches.        Objective:   Vitals: Temp (!) 97.1 F (36.2 C)    Physical Exam She is seen virtually  at home. I am in my office. She is agreeable to visit in this format. Says she is not short of breath and does not appear tachypneic. Not heard to be coughing but sounds a bit nasally congested when she speaks. Denies significant fever, cough or chills. No nausea or vomiting.  Results:   Studies obtained and personally reviewed by me:   Labs:       Component Value Date/Time   NA 136 10/07/2022 0921   K 5.1 10/07/2022 0921   CL 102 10/07/2022 0921   CO2 29 10/07/2022 0921   GLUCOSE 89 10/07/2022 0921   BUN 14 10/07/2022 0921   CREATININE 0.57 10/07/2022 0921   CALCIUM 9.5 10/07/2022 0921    PROT 7.1 10/07/2022 0921   ALBUMIN 4.0 04/20/2017 1029   AST 11 10/07/2022 0921   ALT 10 10/07/2022 0921   ALKPHOS 34 04/20/2017 1029   BILITOT 0.5 10/07/2022 0921   GFRNONAA 97 08/21/2020 1001   GFRAA 112 08/21/2020 1001     Lab Results  Component Value Date   WBC 4.5 10/07/2022   HGB 14.3 10/07/2022   HCT 40.6 10/07/2022   MCV 93.5 10/07/2022   PLT 317 10/07/2022    Lab Results  Component Value Date   CHOL 208 (H) 10/07/2022   HDL 87 10/07/2022   LDLCALC 99 10/07/2022   TRIG 120 10/07/2022   CHOLHDL 2.4 10/07/2022    Lab Results  Component Value Date   HGBA1C 5.2 08/21/2020     Lab Results  Component Value Date   TSH 2.64 10/07/2022       Assessment & Plan:   Acute maxillary sinusitis, acute Covid-19 viral infection - Onset of her symptoms was last Friday evening. On Sunday she tested positive for COVID-19. Discussion regarding treatment options including Paxlovid. Will prescribe Zithromax (Z-Pak) and 150 mg Diflucan to take as needed. She does have tessalon perles at home for cough if needed. She says she really only has mild symptoms more like "cold".  Stay hydrated. Be sure to walk some to ensure lung aeration. Quarantine for 5 days. Call us if she develops shortness of breath or symptoms worsen.   I,Mathew Stumpf,acting as a Neurosurgeon for Margaree Mackintosh, MD.,have documented all relevant documentation on the behalf of Margaree Mackintosh, MD,as directed by  Margaree Mackintosh, MD while in the presence of Margaree Mackintosh, MD.   I, Margaree Mackintosh, MD, have reviewed all documentation for this visit. The documentation on 05/09/23 for the exam, diagnosis, procedures, and orders are all accurate and complete.

## 2023-04-20 NOTE — Telephone Encounter (Signed)
[name] [phone]  Gabriela Green called to say she tested positive for COVID yesterday, she has some nasal congestion, stuffy head, fatigue and runny nose. I have scheduled her for Video visit at 2:30

## 2023-05-09 ENCOUNTER — Encounter: Payer: Self-pay | Admitting: Internal Medicine

## 2023-05-09 NOTE — Patient Instructions (Addendum)
Patient only has mild symptoms.  We discussed Paxlovid versus watchful waiting and antibiotic for maxillary sinusitis.  She prefers antibiotic at this point in time.  She is not short of breath and has no significant cough.  Has nasal congestion.  She was advised to quarantine at home for 5 days and to call us if symptoms worsen.  Have prescribed Zithromax Z-PAK 2 tabs day 1 followed by 1 tab days 2 through 5.  She does have Tessalon Perles at home if needed for cough.  Rest and stay well-hydrated.  Monitor pulse oximetry.

## 2023-06-12 ENCOUNTER — Ambulatory Visit (INDEPENDENT_AMBULATORY_CARE_PROVIDER_SITE_OTHER): Payer: PPO

## 2023-06-12 VITALS — BP 120/70

## 2023-06-12 DIAGNOSIS — Z23 Encounter for immunization: Secondary | ICD-10-CM | POA: Diagnosis not present

## 2023-06-12 NOTE — Progress Notes (Signed)
Patient here today to receive Flu shot. Patient tolerated well with no complications.

## 2023-07-22 IMAGING — MG DIGITAL SCREENING BREAST BILAT IMPLANT W/ TOMO W/ CAD
8 of 12 series · 8 of 28 positions shown · non-contrast
Comparison: Previous exams.

CLINICAL DATA: Screening.

EXAM:
DIGITAL SCREENING BILATERAL MAMMOGRAM WITH IMPLANTS, CAD AND
TOMOSYNTHESIS
TECHNIQUE: Bilateral screening digital craniocaudal and mediolateral oblique
mammograms were obtained. Bilateral screening digital breast
tomosynthesis was performed. The images were evaluated with
computer-aided detection. Standard and/or implant displaced views
were performed.

[R MLO]
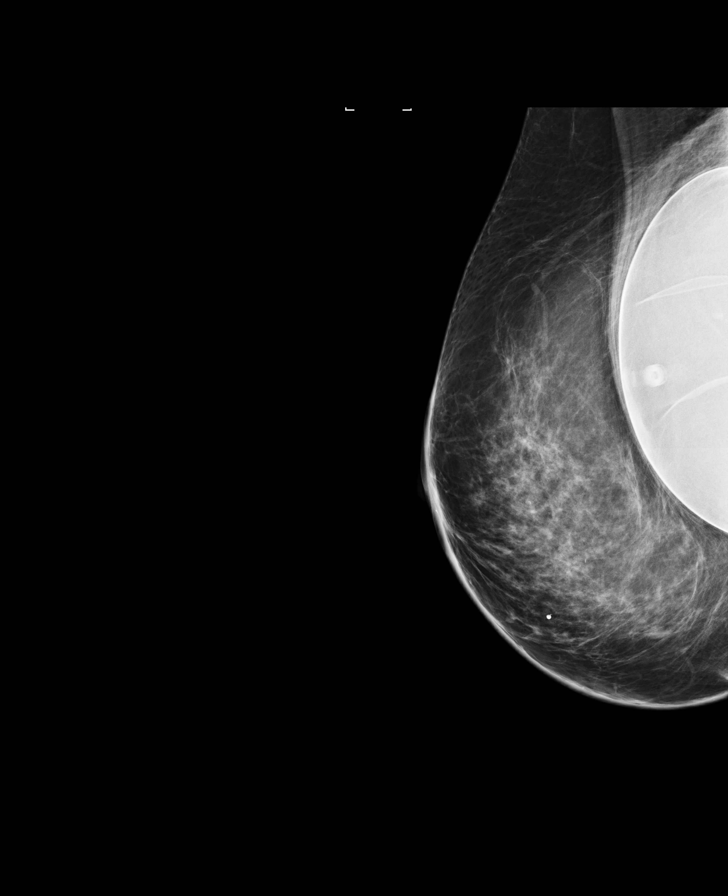

[L CC]
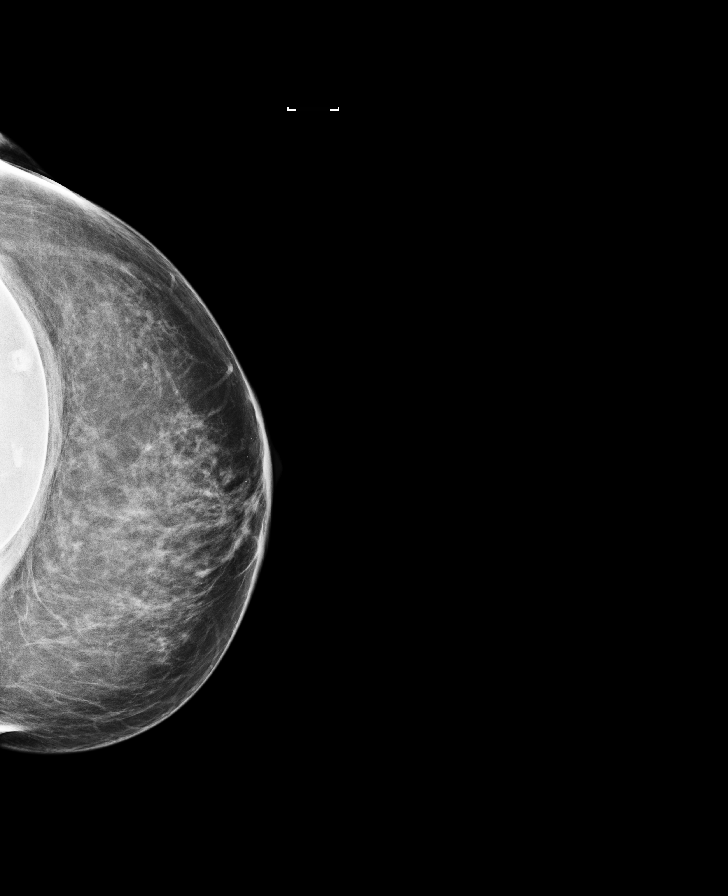

[R CC]
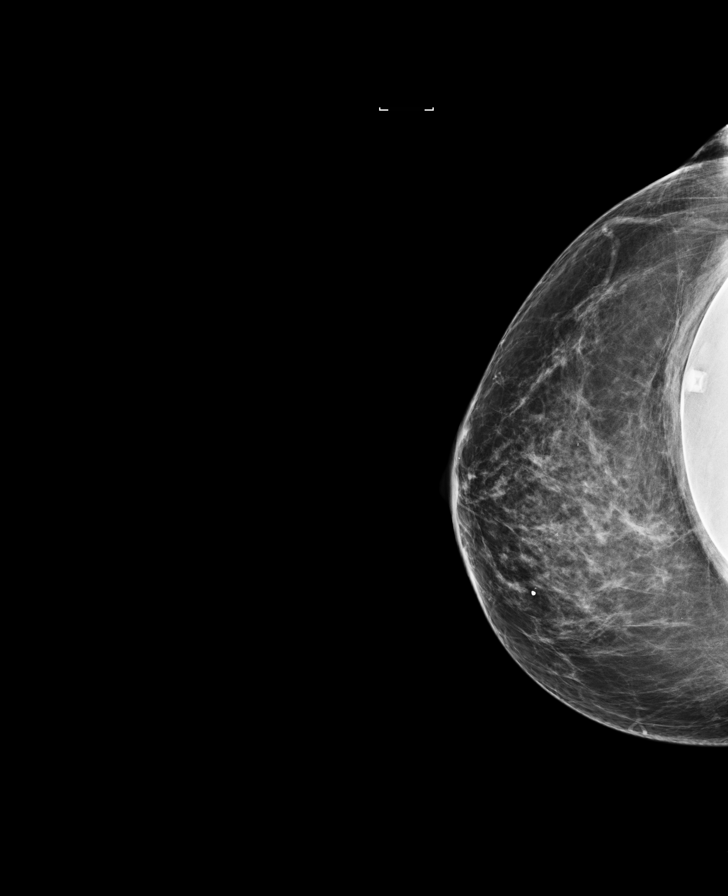

[L MLO]
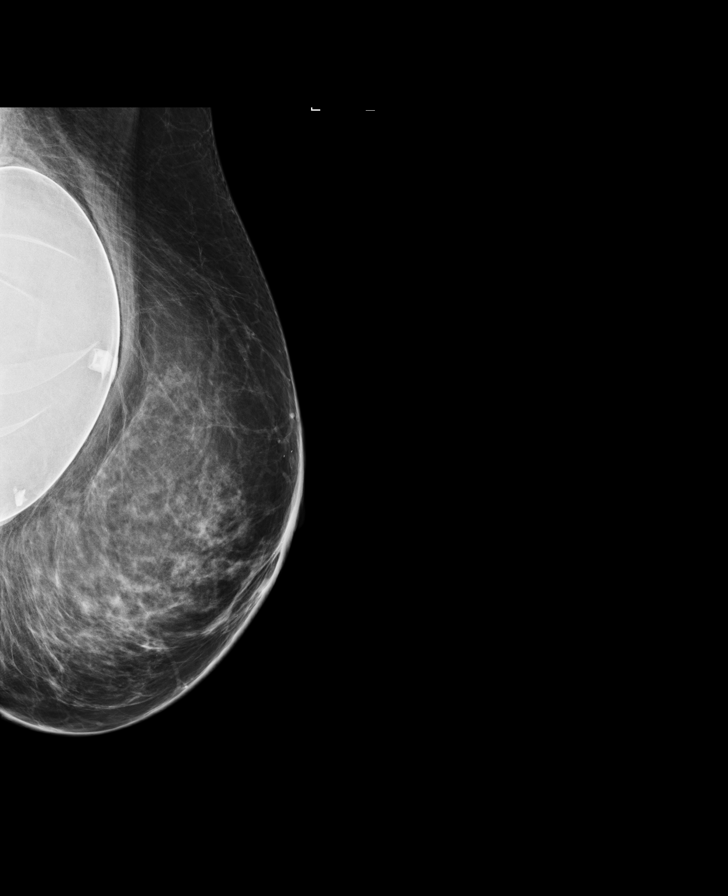

[L CC synth-2D]
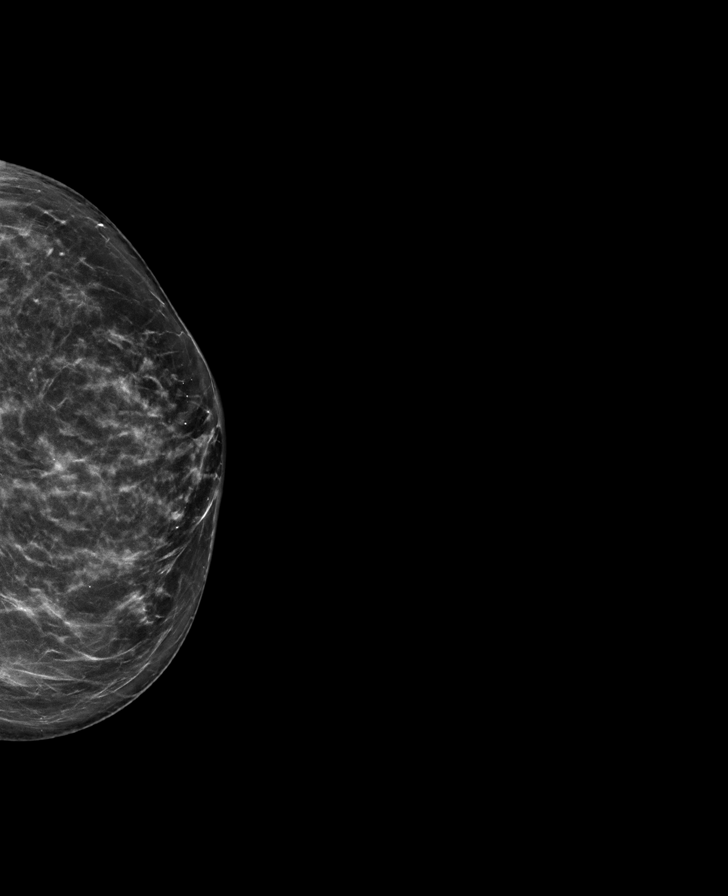

[L MLO synth-2D]
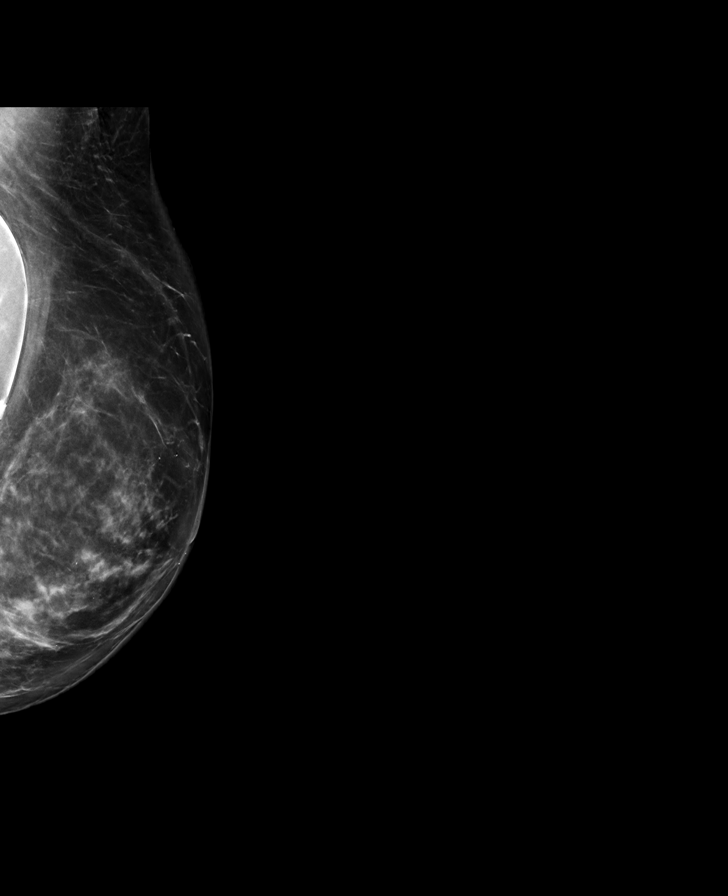

[R MLO synth-2D]
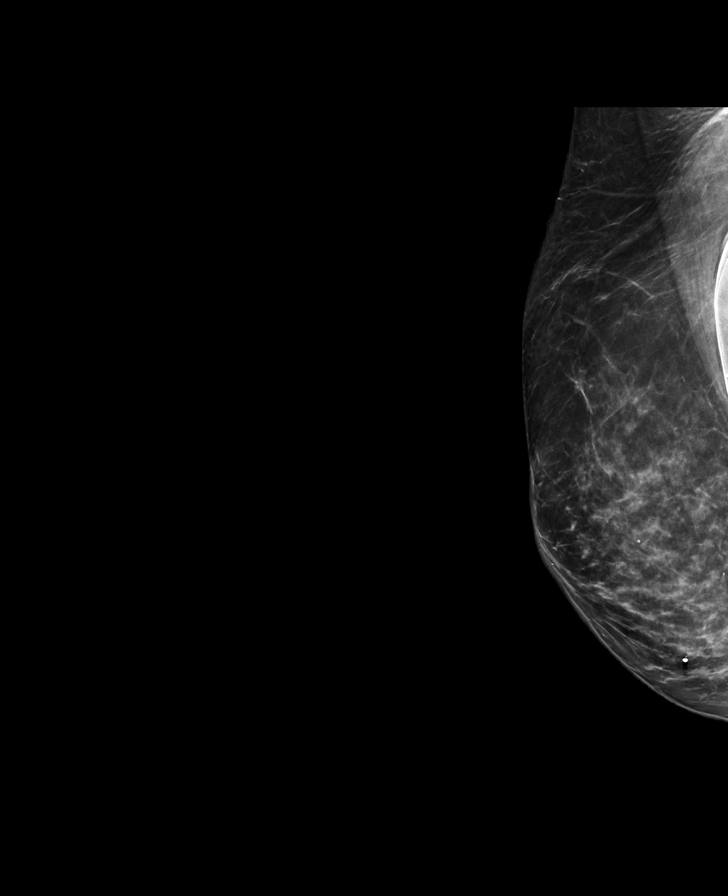

[R CC synth-2D]
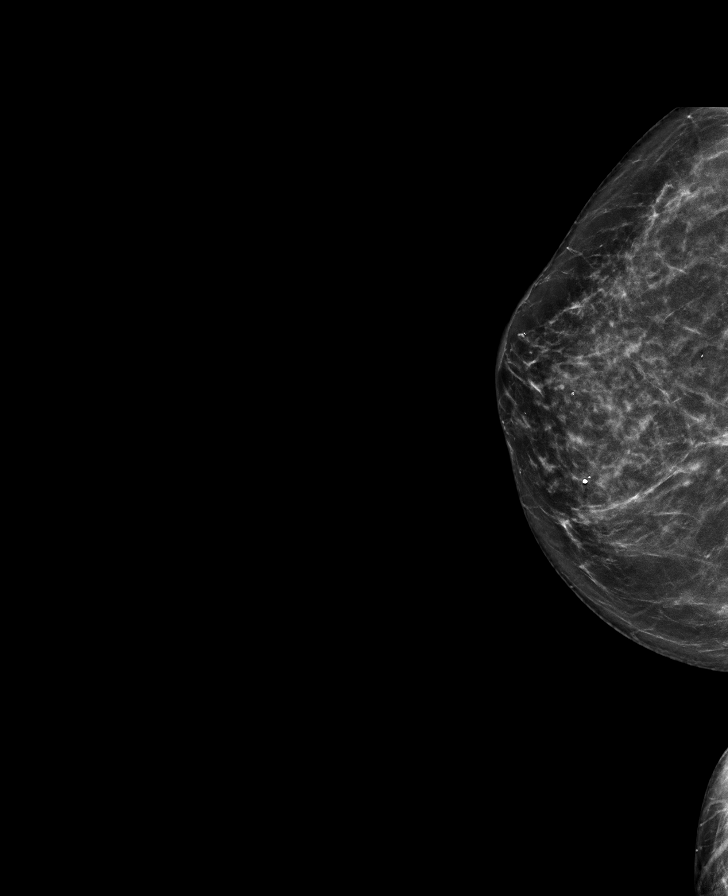

[8 of 28 positions shown; findings below may reference images not displayed]

ACR Breast Density Category c: The breast tissue is heterogeneously
dense, which may obscure small masses.
FINDINGS: The patient has bilateral subpectoral saline implants. There are no
findings suspicious for malignancy.
IMPRESSION: No mammographic evidence of malignancy. A result letter of this
screening mammogram will be mailed directly to the patient.

RECOMMENDATION:
Screening mammogram in one year. (Code:TU-C-UK8)

BI-RADS CATEGORY  1:  Negative.

## 2023-07-30 DIAGNOSIS — Z1211 Encounter for screening for malignant neoplasm of colon: Secondary | ICD-10-CM | POA: Diagnosis not present

## 2023-07-30 DIAGNOSIS — N951 Menopausal and female climacteric states: Secondary | ICD-10-CM | POA: Diagnosis not present

## 2023-07-30 DIAGNOSIS — Z1239 Encounter for other screening for malignant neoplasm of breast: Secondary | ICD-10-CM | POA: Diagnosis not present

## 2023-07-30 DIAGNOSIS — Z13820 Encounter for screening for osteoporosis: Secondary | ICD-10-CM | POA: Diagnosis not present

## 2023-07-30 DIAGNOSIS — Z01419 Encounter for gynecological examination (general) (routine) without abnormal findings: Secondary | ICD-10-CM | POA: Diagnosis not present

## 2023-07-30 DIAGNOSIS — Z1339 Encounter for screening examination for other mental health and behavioral disorders: Secondary | ICD-10-CM | POA: Diagnosis not present

## 2023-07-30 DIAGNOSIS — Z90710 Acquired absence of both cervix and uterus: Secondary | ICD-10-CM | POA: Diagnosis not present

## 2023-08-24 ENCOUNTER — Telehealth: Payer: Self-pay | Admitting: Internal Medicine

## 2023-08-24 NOTE — Telephone Encounter (Signed)
Called and spoke with patient she is going to call and speak with ortho doctors office first and if they can not see her she will call back and schedule appointment with Dr Lenord Fellers.

## 2023-08-24 NOTE — Telephone Encounter (Signed)
Patient is having extreme discomfort in both elbows she wants to know if she needs a referral for this are can she be seen by the dr for this  she has a upcoming appt already  for another issue she would like a callback regarding this matter

## 2023-09-07 ENCOUNTER — Ambulatory Visit (INDEPENDENT_AMBULATORY_CARE_PROVIDER_SITE_OTHER): Payer: PPO

## 2023-09-07 VITALS — BP 110/60 | HR 64 | Ht 67.0 in | Wt 142.0 lb

## 2023-09-07 DIAGNOSIS — Z23 Encounter for immunization: Secondary | ICD-10-CM

## 2023-09-07 NOTE — Progress Notes (Signed)
Patient received a pneumonia 20 vaccine, IM left deltoid Patient tolerated well.

## 2023-09-21 ENCOUNTER — Other Ambulatory Visit: Payer: Self-pay | Admitting: Internal Medicine

## 2023-09-21 DIAGNOSIS — Z1231 Encounter for screening mammogram for malignant neoplasm of breast: Secondary | ICD-10-CM

## 2023-10-07 ENCOUNTER — Ambulatory Visit
Admission: RE | Admit: 2023-10-07 | Discharge: 2023-10-07 | Disposition: A | Discharge status: Home / Self Care | Payer: PPO | Source: Ambulatory Visit | Attending: Internal Medicine | Admitting: Internal Medicine

## 2023-10-07 DIAGNOSIS — M720 Palmar fascial fibromatosis [Dupuytren]: Secondary | ICD-10-CM | POA: Diagnosis not present

## 2023-10-07 DIAGNOSIS — M7711 Lateral epicondylitis, right elbow: Secondary | ICD-10-CM | POA: Diagnosis not present

## 2023-10-07 DIAGNOSIS — Z1231 Encounter for screening mammogram for malignant neoplasm of breast: Secondary | ICD-10-CM

## 2023-10-07 NOTE — Progress Notes (Signed)
 Annual Medicare Wellness Visit   Patient Care Team: [name], MD as PCP - General (Internal Medicine)  Visit Date: 10/13/23   Chief Complaint  Patient presents with   Medicare Wellness   Annual Exam   Subjective:  Patient: Gabriela Green, Female DOB: 1951-02-06, 73 y.o. MRN: [id number]  Gabriela Green is a 73 y.o. Female who presents today for her Annual Medicare Wellness Visit. Patient has history of Vitamin-D Deficiency and IBS.   Reports that she recently has been having pain in her right elbow, which start following a work-out and was initially in both elbows except the left has improved. Has seen somebody for this and is currently sleeping in an arm brace until this improves.   Also mentioned that she has been researching solutions for her Dupuytren's Disease, which does cause some pain in her hands. States that she is looking to try injections first before anything else.   Discussed CT Coronary Calcium Scoring as she mentions that her husband was dxed with lymphoma following a CT scan.   Reports she has started taking Prevagen as she has started noticing some difficulty remembering names.   History of Vitamin D  deficiency treated with 2000 units  Vitamin-D daily.   Irritable Bowel Syndrome treated with sublingual Levsin  as needed.   Labs 10/08/23 CBC: WNL CMP: WNL Lipid Panel, compared to 10/07/22: CHOL 216, elevated from 208; LDL 107, elevated from 99.  TSH: 2.13  Mammogram last completed on 09/24/22. Results for the LEFT BREAST show no suspicious mass, distortion, or micro-calcifications are identified to suggest presence of malignancy. Patient has a retropectoral saline implant in the left breast. RIGHT BREAST showed symmetry in the lower central portion of the right breast is confirmed on spot compression views in the 6 o'clock region of the inframammary fold. The patient has retropectoral saline implant. Repeat scheduled 10/07/2023.  Colonoscopy 05/13/2018. Results  showed moderate diverticulosis in sigmoid colon, otherwise normal on direct and retroflexion views. No specimens collected. Recommended repeat in 2029.   Bone Density 01/26/23 with T-score -0.9, normal.   Vaccine Counseling: Due for Shingles, Covid-19 & Tdap; UTD on PNA and Flu. Expressed hesitancy to get her Shingles vaccination, but would rather that instead of Shingles - recommended choosing a day that she can rest. Is agreeable to updating Tdap, but not today.  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   History of right hand Dupuytren's contracture seen by hand surgeon.   History of osteopenia.  History of allergic rhinitis.  Had breast implants 1998 and subsequent replacements.  Hysterectomy BSO August 2005.  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  History of adhesive capsulitis right shoulder in May 2020 requiring physical therapy.  Had burn on right arm treated by Dr. Lowery, plastic surgeon in April 2020. Family History  Problem Relation Age of Onset   Hyperlipidemia Mother    Heart attack Father    Diabetes Father    Diabetes Sister    Heart disease Paternal Aunt    Breast cancer Maternal Grandmother    Colon cancer Maternal Grandmother 62   Ovarian cancer Other        mat great aunt   Esophageal cancer  Neg Hx    Rectal cancer Neg Hx    Stomach cancer Neg Hx   Family History Narrative: Father died with Pneumonia.  2 brothers and 2 sisters.  1 sister with diabetes.  Social History   Social History Narrative   Patient is divorced, remarried fall 2022. She has 2 daughters, with grandchildren. 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  No tobacco or drug use; alcohol 1 to 2  glasses of wine in the evenings. Some caffeine  Travelled a lot last year, hasn't done much travel so far this year. Review of Systems  Constitutional:  Negative for chills, fever, malaise/fatigue and weight loss.  HENT:  Negative for hearing loss, sinus pain and sore throat.   Respiratory:  Negative for cough, hemoptysis and shortness of breath.   Cardiovascular:  Negative for chest pain, palpitations, leg swelling and PND.  Gastrointestinal:  Negative for abdominal pain, constipation, diarrhea, heartburn, nausea and vomiting.  Genitourinary:  Negative for dysuria, frequency and urgency.  Musculoskeletal:  Negative for back pain, myalgias and neck pain.  Skin:  Negative for itching and rash.  Neurological:  Negative for dizziness, tingling, seizures and headaches.  Endo/Heme/Allergies:  Negative for polydipsia.  Psychiatric/Behavioral:  Negative for depression. The patient is not nervous/anxious.     Objective:  Vitals: BP 120/80   Pulse 80   Ht 5' 7 (1.702 m)   Wt 142 lb (64.4 kg)   SpO2 97%   BMI 22.24 kg/m  Physical Exam Vitals and nursing note reviewed.  Constitutional:      General: She is not in acute distress.    Appearance: Normal appearance. She is not ill-appearing or toxic-appearing.  HENT:     Head: Normocephalic and atraumatic.     Right Ear: Hearing, tympanic membrane, ear canal and external ear normal.     Left Ear: Hearing, tympanic membrane, ear canal and external ear normal.     Mouth/Throat:     Pharynx: Oropharynx is clear.  Eyes:     Extraocular Movements: Extraocular movements intact.     Pupils: Pupils are equal, round, and reactive to light.  Neck:     Thyroid : No thyroid  mass, thyromegaly or thyroid  tenderness.     Vascular: No carotid bruit.  Cardiovascular:     Rate and Rhythm: Normal rate and regular rhythm. No extrasystoles are present.    Pulses:          Dorsalis pedis pulses are 1+ on the right side and 1+ on the left side.     Heart  sounds: Normal heart sounds. No murmur heard.    No friction rub. No gallop.  Pulmonary:     Effort: Pulmonary effort is normal.     Breath sounds: Normal breath sounds. No decreased breath sounds, wheezing, rhonchi or rales.  Chest:     Chest wall: No mass.  Abdominal:     Palpations: Abdomen is soft. There is no hepatomegaly, splenomegaly or mass.     Tenderness: There is no abdominal tenderness.     Hernia: No hernia is present.  Musculoskeletal:     Cervical back: Normal range of motion.     Right lower leg: No edema.     Left lower leg: No edema.  Lymphadenopathy:     Cervical: No cervical adenopathy.     Upper Body:     Right upper body: No supraclavicular adenopathy.     Left upper body: No supraclavicular adenopathy.  Skin:    General: Skin is warm and dry.  Neurological:     General: No focal deficit present.     Mental Status: She is alert and oriented to person, place, and time. Mental status is at baseline.     Sensory: Sensation is intact.     Motor: Motor function is intact. No weakness.     Deep Tendon Reflexes: Reflexes are normal and symmetric.  Psychiatric:        Attention and Perception: Attention normal.        Mood and Affect: Mood normal.        Speech: Speech normal.        Behavior: Behavior normal.        Thought Content: Thought content normal.        Cognition and Memory: Cognition normal.        Judgment: Judgment normal.    Most Recent Functional Status Assessment:    10/13/2023   11:06 AM  In your present state of health, do you have any difficulty performing the following activities:  Hearing? 0  Vision? 0  Difficulty concentrating or making decisions? 0  Walking or climbing stairs? 0  Dressing or bathing? 0  Doing errands, shopping? 0  Preparing Food and eating ? N  Using the Toilet? N  In the past six months, have you accidently leaked urine? N  Do you have problems with loss of bowel control? N  Managing your Medications? N   Managing your Finances? N  Housekeeping or managing your Housekeeping? N   Most Recent Fall Risk Assessment:    10/13/2023   11:14 AM  Fall Risk   Falls in the past year? 0  Number falls in past yr: 0  Injury with Fall? 0  Risk for fall due to : No Fall Risks  Follow up Falls prevention discussed;Education provided;Falls evaluation completed   Most Recent Depression Screenings:    10/13/2023   11:14 AM 10/07/2022   11:09 AM  PHQ 2/9 Scores  PHQ - 2 Score 0 0   Most Recent Cognitive Screening:    10/07/2022   11:11 AM  6CIT Screen  What Year? 0 points  What month? 0 points  What time? 0 points  Count back from 20 0 points  Months in reverse 0 points  Repeat phrase 0 points  Total Score 0 points   Results:  Studies obtained and personally reviewed by me:  Mammogram last completed on 09/24/22. Results for the left breast show no suspicious mass, distortion, or microcalcifications are identified to suggest presence of malignancy. Patient has a retropectoral saline implant in the left breast. Right breast showed symmetry in the lower central portion of the right breast is confirmed on spot compression views in the 6 o'clock region of the inframammary fold. The patient has retropectoral saline implant. Repeat scheduled 10/07/2023.  Colonoscopy 05/13/2018. Results showed moderate diverticulosis in sigmoid colon, otherwise normal on direct and retroflexion views. No specimens collected. Recommended repeat in 2029. Irritable Bowel Syndrome treated with sublingual Levsin  as needed   Bone Density 01/26/23 with T-score -0.9, normal.   Labs:     Component Value Date/Time   NA 137 10/08/2023 0930   K 4.7 10/08/2023 0930   CL 102 10/08/2023 0930   CO2 26 10/08/2023 0930   GLUCOSE 88 10/08/2023 0930   BUN 12 10/08/2023 0930   CREATININE 0.67 10/08/2023 0930   CALCIUM 9.6 10/08/2023 0930   PROT 7.0 10/08/2023 0930  ALBUMIN 4.0 04/20/2017 1029   AST 13 10/08/2023 0930   ALT 11  10/08/2023 0930   ALKPHOS 34 04/20/2017 1029   BILITOT 0.3 10/08/2023 0930   GFRNONAA 97 08/21/2020 1001   GFRAA 112 08/21/2020 1001    Lab Results  Component Value Date   WBC 4.5 10/08/2023   HGB 14.7 10/08/2023   HCT 42.4 10/08/2023   MCV 93.6 10/08/2023   PLT 331 10/08/2023   Lab Results  Component Value Date   CHOL 216 (H) 10/08/2023   HDL 90 10/08/2023   LDLCALC 107 (H) 10/08/2023   TRIG 97 10/08/2023   CHOLHDL 2.4 10/08/2023   Lab Results  Component Value Date   HGBA1C 5.2 08/21/2020    Lab Results  Component Value Date   TSH 2.13 10/08/2023   Assessment & Plan:  Other Labs Reviewed today: CBC: WNL CMP: WNL Lipid Panel, compared to 10/07/22: CHOL 216, elevated from 208; LDL 107, elevated from 99.  TSH: 2.13  Pain, Right Elbow: has seen Dr. Camella for this recently and is currently sleeping in an arm brace until this improves.  Also mentioned that she has been researching solutions for her Dupuytren's Disease, which does cause some pain in her hand. States that she is looking to try injections first before anything else. Suggest hand surgery consult.  Discussed CT Coronary Calcium Scoring - she would like to have done as her husband was found to have lymphoma following a CT - will order this    Mild Memory Issues: started taking Prevagen after noticing some difficulty remembering names.   Vitamin D  Deficiency treated with 2000 units Vitamin-D daily.   Irritable Bowel Syndrome treated with sublingual Levsin  as needed.  Mammogram last completed on 09/24/22. Results for the left breast show no suspicious mass, distortion, or microcalcifications are identified to suggest presence of malignancy. Patient has a retropectoral saline implant in the left breast. Right breast showed symmetry in the lower central portion of the right breast is confirmed on spot compression views in the 6 o'clock region of the inframammary fold. The patient has retropectoral saline implant.  Repeat scheduled 10/07/2023.  Colonoscopy 05/13/2018. Results showed moderate diverticulosis in sigmoid colon, otherwise normal on direct and retroflexion views. No specimens collected. Recommended repeat in 2029. Irritable Bowel Syndrome treated with sublingual Levsin  as needed   Bone Density 01/26/23 with T-score -0.9, normal.   Vaccine Counseling: Due for Covid-19, Shingles, & Tdap; UTD on PNA and Flu. Pt expressed hesitancy to get her Shingles vaccination, but would rather that instead of Shingles - recommended choosing a day that she can rest. Is agreeable to updating Tdap, but not today.    Annual wellness visit done today including the all of the following: Reviewed patient's Family Medical History Reviewed and updated list of patient's medical providers Assessment of cognitive impairment was done Assessed patient's functional ability Established a written schedule for health screening services Health Risk Assessent Completed and Reviewed  Discussed health benefits of physical activity, and encouraged her to engage in regular exercise appropriate for her age and condition.    I,Emily Lagle,acting as a neurosurgeon for Ronal JINNY Hailstone, MD.,have documented all relevant documentation on the behalf of Ronal JINNY Hailstone, MD,as directed by  Ronal JINNY Hailstone, MD while in the presence of Ronal JINNY Hailstone, MD.   I, Ronal JINNY Hailstone, MD, have reviewed all documentation for this visit. The documentation on 10/28/23 for the exam, diagnosis, procedures, and orders are all accurate and complete.

## 2023-10-08 ENCOUNTER — Other Ambulatory Visit: Payer: PPO

## 2023-10-08 DIAGNOSIS — E7800 Pure hypercholesterolemia, unspecified: Secondary | ICD-10-CM

## 2023-10-08 DIAGNOSIS — Z0000 Encounter for general adult medical examination without abnormal findings: Secondary | ICD-10-CM | POA: Diagnosis not present

## 2023-10-08 DIAGNOSIS — K580 Irritable bowel syndrome with diarrhea: Secondary | ICD-10-CM

## 2023-10-08 DIAGNOSIS — Z1329 Encounter for screening for other suspected endocrine disorder: Secondary | ICD-10-CM

## 2023-10-09 LAB — CBC WITH DIFFERENTIAL/PLATELET
Absolute Lymphocytes: 1211 {cells}/uL (ref 850–3900)
Absolute Monocytes: 437 {cells}/uL (ref 200–950)
Basophils Absolute: 50 {cells}/uL (ref 0–200)
Basophils Relative: 1.1 %
Eosinophils Absolute: 158 {cells}/uL (ref 15–500)
Eosinophils Relative: 3.5 %
HCT: 42.4 % (ref 35.0–45.0)
Hemoglobin: 14.7 g/dL (ref 11.7–15.5)
MCH: 32.5 pg (ref 27.0–33.0)
MCHC: 34.7 g/dL (ref 32.0–36.0)
MCV: 93.6 fL (ref 80.0–100.0)
MPV: 9.7 fL (ref 7.5–12.5)
Monocytes Relative: 9.7 %
Neutro Abs: 2646 {cells}/uL (ref 1500–7800)
Neutrophils Relative %: 58.8 %
Platelets: 331 10*3/uL (ref 140–400)
RBC: 4.53 10*6/uL (ref 3.80–5.10)
RDW: 11.2 % (ref 11.0–15.0)
Total Lymphocyte: 26.9 %
WBC: 4.5 10*3/uL (ref 3.8–10.8)

## 2023-10-09 LAB — COMPLETE METABOLIC PANEL WITH GFR
AG Ratio: 1.5 (calc) (ref 1.0–2.5)
ALT: 11 U/L (ref 6–29)
AST: 13 U/L (ref 10–35)
Albumin: 4.2 g/dL (ref 3.6–5.1)
Alkaline phosphatase (APISO): 37 U/L (ref 37–153)
BUN: 12 mg/dL (ref 7–25)
CO2: 26 mmol/L (ref 20–32)
Calcium: 9.6 mg/dL (ref 8.6–10.4)
Chloride: 102 mmol/L (ref 98–110)
Creat: 0.67 mg/dL (ref 0.60–1.00)
Globulin: 2.8 g/dL (ref 1.9–3.7)
Glucose, Bld: 88 mg/dL (ref 65–99)
Potassium: 4.7 mmol/L (ref 3.5–5.3)
Sodium: 137 mmol/L (ref 135–146)
Total Bilirubin: 0.3 mg/dL (ref 0.2–1.2)
Total Protein: 7 g/dL (ref 6.1–8.1)
eGFR: 94 mL/min/{1.73_m2} (ref >=60)

## 2023-10-09 LAB — TSH: TSH: 2.13 m[IU]/L (ref 0.40–4.50)

## 2023-10-09 LAB — LIPID PANEL
Cholesterol: 216 mg/dL — ABNORMAL HIGH (ref <200)
HDL: 90 mg/dL (ref >=50)
LDL Cholesterol (Calc): 107 mg/dL — ABNORMAL HIGH
Non-HDL Cholesterol (Calc): 126 mg/dL (ref <130)
Total CHOL/HDL Ratio: 2.4 (calc) (ref <5.0)
Triglycerides: 97 mg/dL (ref <150)

## 2023-10-12 ENCOUNTER — Encounter: Payer: Self-pay | Admitting: Internal Medicine

## 2023-10-13 ENCOUNTER — Encounter: Payer: Self-pay | Admitting: Internal Medicine

## 2023-10-13 ENCOUNTER — Ambulatory Visit (INDEPENDENT_AMBULATORY_CARE_PROVIDER_SITE_OTHER): Payer: PPO | Admitting: Internal Medicine

## 2023-10-13 VITALS — BP 120/80 | HR 80 | Ht 67.0 in | Wt 142.0 lb

## 2023-10-13 DIAGNOSIS — Z9079 Acquired absence of other genital organ(s): Secondary | ICD-10-CM | POA: Diagnosis not present

## 2023-10-13 DIAGNOSIS — Z9882 Breast implant status: Secondary | ICD-10-CM | POA: Diagnosis not present

## 2023-10-13 DIAGNOSIS — M720 Palmar fascial fibromatosis [Dupuytren]: Secondary | ICD-10-CM

## 2023-10-13 DIAGNOSIS — K580 Irritable bowel syndrome with diarrhea: Secondary | ICD-10-CM | POA: Diagnosis not present

## 2023-10-13 DIAGNOSIS — Z0000 Encounter for general adult medical examination without abnormal findings: Secondary | ICD-10-CM

## 2023-10-13 DIAGNOSIS — Z9889 Other specified postprocedural states: Secondary | ICD-10-CM

## 2023-10-13 DIAGNOSIS — M7711 Lateral epicondylitis, right elbow: Secondary | ICD-10-CM

## 2023-10-13 DIAGNOSIS — Z136 Encounter for screening for cardiovascular disorders: Secondary | ICD-10-CM | POA: Diagnosis not present

## 2023-10-13 DIAGNOSIS — Z90710 Acquired absence of both cervix and uterus: Secondary | ICD-10-CM | POA: Diagnosis not present

## 2023-10-13 DIAGNOSIS — Z90722 Acquired absence of ovaries, bilateral: Secondary | ICD-10-CM | POA: Diagnosis not present

## 2023-10-28 NOTE — Patient Instructions (Addendum)
It was a pleasure to see you today. Labs are stable.Return in one year or as needed.

## 2023-12-04 ENCOUNTER — Ambulatory Visit (HOSPITAL_BASED_OUTPATIENT_CLINIC_OR_DEPARTMENT_OTHER)
Admission: RE | Admit: 2023-12-04 | Discharge: 2023-12-04 | Disposition: A | Discharge status: Home / Self Care | Payer: Self-pay | Source: Ambulatory Visit | Attending: Internal Medicine | Admitting: Internal Medicine

## 2023-12-04 DIAGNOSIS — Z136 Encounter for screening for cardiovascular disorders: Secondary | ICD-10-CM | POA: Insufficient documentation

## 2024-03-18 DIAGNOSIS — M25551 Pain in right hip: Secondary | ICD-10-CM | POA: Diagnosis not present

## 2024-06-06 DIAGNOSIS — L659 Nonscarring hair loss, unspecified: Secondary | ICD-10-CM | POA: Diagnosis not present

## 2024-06-06 DIAGNOSIS — Z79899 Other long term (current) drug therapy: Secondary | ICD-10-CM | POA: Diagnosis not present

## 2024-06-06 DIAGNOSIS — L649 Androgenic alopecia, unspecified: Secondary | ICD-10-CM | POA: Diagnosis not present

## 2024-06-06 DIAGNOSIS — L245 Irritant contact dermatitis due to other chemical products: Secondary | ICD-10-CM | POA: Diagnosis not present

## 2024-07-25 ENCOUNTER — Other Ambulatory Visit: Payer: Self-pay | Admitting: Internal Medicine

## 2024-07-25 DIAGNOSIS — Z1231 Encounter for screening mammogram for malignant neoplasm of breast: Secondary | ICD-10-CM

## 2024-08-03 DIAGNOSIS — M720 Palmar fascial fibromatosis [Dupuytren]: Secondary | ICD-10-CM | POA: Diagnosis not present

## 2024-08-03 DIAGNOSIS — M7711 Lateral epicondylitis, right elbow: Secondary | ICD-10-CM | POA: Diagnosis not present

## 2024-08-03 DIAGNOSIS — M79641 Pain in right hand: Secondary | ICD-10-CM | POA: Diagnosis not present

## 2024-08-03 DIAGNOSIS — M25521 Pain in right elbow: Secondary | ICD-10-CM | POA: Diagnosis not present

## 2024-08-09 ENCOUNTER — Ambulatory Visit

## 2024-08-09 VITALS — BP 20/4 | HR 78 | Ht 67.0 in | Wt 142.0 lb

## 2024-08-09 DIAGNOSIS — Z23 Encounter for immunization: Secondary | ICD-10-CM | POA: Diagnosis not present

## 2024-08-09 NOTE — Progress Notes (Signed)
 Patient presentes to the clinic for a flu vaccine. Patient received a flu vaccine L deltoid Im, patient tolerated well.

## 2024-08-11 ENCOUNTER — Encounter: Payer: Self-pay | Admitting: Internal Medicine

## 2024-08-11 ENCOUNTER — Ambulatory Visit: Admitting: Internal Medicine

## 2024-08-11 VITALS — BP 110/70 | HR 78 | Ht 67.0 in | Wt 144.0 lb

## 2024-08-11 DIAGNOSIS — K580 Irritable bowel syndrome with diarrhea: Secondary | ICD-10-CM

## 2024-08-11 MED ORDER — ALPRAZOLAM 0.25 MG PO TABS: 0.2500 mg | ORAL_TABLET | Freq: Two times a day (BID) | ORAL | 0 refills | Status: AC | PRN

## 2024-08-11 NOTE — Progress Notes (Signed)
 "   Patient Care Team: [name], MD as PCP - General (Internal Medicine)  Visit Date: 08/11/24  Subjective:    Patient ID: Gabriela Green , Female   DOB: [birth year], 73 y.o.    MRN: [id number]   73 y.o. Female presents today for irritable bowel syndrome symptoms. Patient has excellent health. No chronic medical problems.  History of Irritable Bowel Syndrome treated with sublingual Levsin  as needed. Despite having Levsin  she is still having fecal urgency that that she says is inconvenient. She is concerned that she she could have inflammatory bowel disease.   Situation stress at work discussed today.    Past Medical History:  Diagnosis Date   Allergy    HSV-2 (herpes simplex virus 2) infection    IBS (irritable bowel syndrome)    Interstitial cystitis    Nephrolithiasis 06/2017   TMJ syndrome      Family History  Problem Relation Age of Onset   Hyperlipidemia Mother    Heart attack Father    Diabetes Father    Diabetes Sister    Heart disease Paternal Aunt    Breast cancer Maternal Grandmother    Colon cancer Maternal Grandmother 27   Ovarian cancer Other        mat great aunt   Esophageal cancer Neg Hx    Rectal cancer Neg Hx    Stomach cancer Neg Hx     Social History   Social History Narrative   Patient is divorced, remarried fall 2022. She has 2 daughters, with grandchildren. 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  No tobacco or drug use; alcohol 1 to 2 glasses of wine in the evenings. Some caffeine      Review of Systems  Gastrointestinal:  Positive for abdominal pain and diarrhea.        Objective:   Vitals: BP 110/70   Pulse 78   Ht 5' 7 (1.702 m)   Wt 144 lb (65.3 kg)   SpO2 98%   BMI 22.55 kg/m    Physical Exam Vitals and nursing note reviewed.  Constitutional:      General: She is not in acute distress.    Appearance: Normal appearance. She is not toxic-appearing.  HENT:     Head: Normocephalic and  atraumatic.  Cardiovascular:     Rate and Rhythm: Normal rate and regular rhythm. No extrasystoles are present.    Pulses: Normal pulses.     Heart sounds: Normal heart sounds. No murmur heard.    No friction rub. No gallop.  Pulmonary:     Effort: Pulmonary effort is normal. No respiratory distress.     Breath sounds: Normal breath sounds. No wheezing or rales.  Abdominal:     Palpations: Abdomen is soft. There is no hepatomegaly, splenomegaly or mass.     Tenderness: There is no abdominal tenderness.  Skin:    General: Skin is warm and dry.  Neurological:     Mental Status: She is alert and oriented to person, place, and time. Mental status is at baseline.  Psychiatric:        Mood and Affect: Mood normal.        Behavior: Behavior normal.        Thought Content: Thought content normal.        Judgment: Judgment normal.       Results:     Labs:       Component Value Date/Time  NA 137 10/08/2023 0930   K 4.7 10/08/2023 0930   CL 102 10/08/2023 0930   CO2 26 10/08/2023 0930   GLUCOSE 88 10/08/2023 0930   BUN 12 10/08/2023 0930   CREATININE 0.67 10/08/2023 0930   CALCIUM 9.6 10/08/2023 0930   PROT 7.0 10/08/2023 0930   ALBUMIN 4.0 04/20/2017 1029   AST 13 10/08/2023 0930   ALT 11 10/08/2023 0930   ALKPHOS 34 04/20/2017 1029   BILITOT 0.3 10/08/2023 0930   GFRNONAA 97 08/21/2020 1001   GFRAA 112 08/21/2020 1001     Lab Results  Component Value Date   WBC 4.5 10/08/2023   HGB 14.7 10/08/2023   HCT 42.4 10/08/2023   MCV 93.6 10/08/2023   PLT 331 10/08/2023    Lab Results  Component Value Date   CHOL 216 (H) 10/08/2023   HDL 90 10/08/2023   LDLCALC 107 (H) 10/08/2023   TRIG 97 10/08/2023   CHOLHDL 2.4 10/08/2023    Lab Results  Component Value Date   HGBA1C 5.2 08/21/2020     Lab Results  Component Value Date   TSH 2.13 10/08/2023        Assessment & Plan:  Irritable bowel syndrome with Diarrhea  Plan: Try to avoid foods that cause  diarrhea such as fried foods. or milk based foods. Try to limit alcohol which can contribute to IBS symptoms. Try Xanax  twice daily as needed. Will help anxiety and irritable bowel symptoms. Call if not improving in 2-4 weeks or sooner if worse.   Meds ordered this encounter  Medications   ALPRAZolam  (XANAX ) 0.25 MG tablet    Sig: Take 1 tablet (0.25 mg total) by mouth 2 (two) times daily as needed for anxiety.    Dispense:  30 tablet    Refill:  0   Orders Placed This Encounter  Procedures   Ambulatory referral to Gastroenterology    Referral Priority:   Routine    Referral Type:   Consultation    Referral Reason:   Specialty Services Required    Referred to Provider:   Avram Lupita BRAVO, MD    Number of Visits Requested:   1    Irritable Bowel Syndrome-D treated with sublingual Levsin  as needed. Despite having Levsin  she is still having urgency that that she says is inconvenient. She is concerned that she she could have inflammatory bowel disease.  Referred to [facility]  Situation stress at work discussed    Xanax  0.25 mg twice daily as needed.   I,Makayla C Reid,acting as a scribe for Ronal JINNY Hailstone, MD.,have documented all relevant documentation on the behalf of Ronal JINNY Hailstone, MD,as directed by  Ronal JINNY Hailstone, MD while in the presence of Ronal JINNY Hailstone, MD.  I, Ronal JINNY Hailstone, MD, have reviewed all documentation for this visit. The documentation on 08/11/2024 for the exam, diagnosis, procedures, and orders are all accurate and complete.   Time spent with patient is 30 minutes    "

## 2024-09-01 NOTE — Patient Instructions (Addendum)
 Patient concerned about inflammatory bowel disease and would like to see Gastroenterologist. Referral made to [facility]. Try Xanax  twice daily as needed for IBS symptoms. Watch diet. Limit alcohol intake.

## 2024-09-05 ENCOUNTER — Ambulatory Visit: Admitting: Internal Medicine

## 2024-09-05 ENCOUNTER — Encounter: Payer: Self-pay | Admitting: Internal Medicine

## 2024-09-05 VITALS — BP 120/60 | HR 95 | Temp 98.2°F | Ht 67.0 in | Wt 144.0 lb

## 2024-09-05 DIAGNOSIS — R059 Cough, unspecified: Secondary | ICD-10-CM | POA: Diagnosis not present

## 2024-09-05 DIAGNOSIS — R0981 Nasal congestion: Secondary | ICD-10-CM

## 2024-09-05 DIAGNOSIS — J22 Unspecified acute lower respiratory infection: Secondary | ICD-10-CM

## 2024-09-05 DIAGNOSIS — J029 Acute pharyngitis, unspecified: Secondary | ICD-10-CM

## 2024-09-05 LAB — POC COVID19/FLU A&B COMBO
Covid Antigen, POC: NEGATIVE
Influenza A Antigen, POC: NEGATIVE
Influenza B Antigen, POC: NEGATIVE

## 2024-09-05 LAB — POCT RAPID STREP A (OFFICE): Rapid Strep A Screen: NEGATIVE

## 2024-09-05 MED ORDER — FLUCONAZOLE 150 MG PO TABS: 150.0000 mg | ORAL_TABLET | Freq: Once | ORAL | 0 refills | Status: AC

## 2024-09-05 MED ORDER — AZITHROMYCIN 250 MG PO TABS: ORAL_TABLET | ORAL | 0 refills | Status: AC

## 2024-09-05 MED ORDER — HYDROCODONE BIT-HOMATROP MBR 5-1.5 MG/5ML PO SOLN: 5.0000 mL | Freq: Three times a day (TID) | ORAL | 0 refills | Status: DC | PRN

## 2024-09-05 NOTE — Progress Notes (Signed)
 "   Patient Care Team: [name], MD as PCP - General (Internal Medicine)  Visit Date: 09/05/2024  Subjective:    Patient ID: Gabriela Green , Female   DOB: June 05, 1951, 73 y.o.    MRN: [id number]   73 y.o. Female presents today for cough. Patient has a past medical history of Irritable bowel syndrome.  She had an area near her back molar that has been swollen for about 4 or five days. She initially had a sore throat and then developed a cough. She hosted many people over the holidays and two of the people there later became sick. She had been treating it with tessalon  and tylenol . Influenza, Covid-19 and strep tests were negative.  Past Medical History:  Diagnosis Date   Allergy    HSV-2 (herpes simplex virus 2) infection    IBS (irritable bowel syndrome)    Interstitial cystitis    Nephrolithiasis 06/2017   TMJ syndrome      Family History  Problem Relation Age of Onset   Hyperlipidemia Mother    Heart attack Father    Diabetes Father    Diabetes Sister    Heart disease Paternal Aunt    Breast cancer Maternal Grandmother    Colon cancer Maternal Grandmother 67   Ovarian cancer Other        mat great aunt   Esophageal cancer Neg Hx    Rectal cancer Neg Hx    Stomach cancer Neg Hx     Social History   Social History Narrative   Patient is divorced, remarried fall 2022. She has 2 daughters, with grandchildren. 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  No tobacco or drug use; alcohol 1 to 2 glasses of wine in the evenings. Some caffeine      Review of Systems  HENT:  Positive for congestion, ear pain and sore throat.   Respiratory:  Positive for cough.         Objective:   Vitals: BP 120/60   Pulse 95   Temp 98.2 F (36.8 C)   Ht 5' 7 (1.702 m)   Wt 144 lb (65.3 kg)   SpO2 96%   BMI 22.55 kg/m    Physical Exam Vitals and nursing note reviewed.  Constitutional:      General: She is not in acute distress.     Appearance: Normal appearance. She is not ill-appearing.  HENT:     Head: Normocephalic and atraumatic.     Right Ear: Tympanic membrane, ear canal and external ear normal.     Left Ear: Tympanic membrane, ear canal and external ear normal.     Ears:     Comments: Right TM Slightly full and pink   Left TM slightly full     Mouth/Throat:     Mouth: Mucous membranes are moist.     Pharynx: Oropharynx is clear. No oropharyngeal exudate or posterior oropharyngeal erythema.     Comments: Pharynx slightly injected.  Pulmonary:     Effort: Pulmonary effort is normal.     Breath sounds: Normal breath sounds. No wheezing, rhonchi or rales.  Lymphadenopathy:     Cervical: No cervical adenopathy.  Skin:    General: Skin is warm and dry.  Neurological:     Mental Status: She is alert and oriented to person, place, and time. Mental status is at baseline.  Psychiatric:        Mood and Affect: Mood normal.  Behavior: Behavior normal.        Thought Content: Thought content normal.        Judgment: Judgment normal.       Results:    Labs:       Component Value Date/Time   NA 137 10/08/2023 0930   K 4.7 10/08/2023 0930   CL 102 10/08/2023 0930   CO2 26 10/08/2023 0930   GLUCOSE 88 10/08/2023 0930   BUN 12 10/08/2023 0930   CREATININE 0.67 10/08/2023 0930   CALCIUM 9.6 10/08/2023 0930   PROT 7.0 10/08/2023 0930   ALBUMIN 4.0 04/20/2017 1029   AST 13 10/08/2023 0930   ALT 11 10/08/2023 0930   ALKPHOS 34 04/20/2017 1029   BILITOT 0.3 10/08/2023 0930   GFRNONAA 97 08/21/2020 1001   GFRAA 112 08/21/2020 1001     Lab Results  Component Value Date   WBC 4.5 10/08/2023   HGB 14.7 10/08/2023   HCT 42.4 10/08/2023   MCV 93.6 10/08/2023   PLT 331 10/08/2023    Lab Results  Component Value Date   CHOL 216 (H) 10/08/2023   HDL 90 10/08/2023   LDLCALC 107 (H) 10/08/2023   TRIG 97 10/08/2023   CHOLHDL 2.4 10/08/2023    Lab Results  Component Value Date   HGBA1C 5.2  08/21/2020     Lab Results  Component Value Date   TSH 2.13 10/08/2023        Assessment & Plan:   Orders Placed This Encounter  Procedures   POCT rapid strep A   POC Covid19/Flu A&B Antigen   Meds ordered this encounter  Medications   HYDROcodone  bit-homatropine (HYCODAN) 5-1.5 MG/5ML syrup    Sig: Take 5 mLs by mouth every 8 (eight) hours as needed for cough.    Dispense:  120 mL    Refill:  0   azithromycin  (ZITHROMAX ) 250 MG tablet    Sig: Take 2 tablets on day 1, then 1 tablet daily on days 2 through 5    Dispense:  6 tablet    Refill:  0   Sore throat: She had an area near her back molar that has been swollen for about 4 or five days. She initially had a sore throat and then developed a cough. She hosted many people over the holidays and two of the people there later became sick. She had been treating it with tessalon  and tylenol . Influenza, Covid-19 and strep tests were negative.   Hycodan 5 mL every 8 hours as needed for cough prescribed.   Zithromax  250 mg two tablets on day one, one tablet daily on days 2-5 prescribed.   Diflucan  150 mg prescribed.    I,Makayla C Reid,acting as a scribe for Ronal JINNY Hailstone, MD.,have documented all relevant documentation on the behalf of Ronal JINNY Hailstone, MD,as directed by  Ronal JINNY Hailstone, MD while in the presence of Ronal JINNY Hailstone, MD.      "

## 2024-09-06 NOTE — Patient Instructions (Signed)
 We are sorry you are not feeling well today.  We have performed tests today for COVID-19, group A strep, influenza-all of which were negative.  Suspect that you have a viral respiratory infection but are going to send in Hycodan 1 teaspoon every 8 hours as needed for cough and also Zithromax  Z-PAK 2 tabs day 1 followed by 1 tab days 2 through 5.  May take Diflucan  150 mg tablet should you develop Candida vaginitis while on Zithromax .  Rest and stay well-hydrated.  Call if not improving in 48 hours or sooner if worse.

## 2024-10-06 ENCOUNTER — Other Ambulatory Visit: Payer: Self-pay | Admitting: Internal Medicine

## 2024-10-06 ENCOUNTER — Ambulatory Visit
Admission: RE | Admit: 2024-10-06 | Discharge: 2024-10-06 | Disposition: A | Discharge status: Home / Self Care | Source: Ambulatory Visit | Attending: Internal Medicine | Admitting: Internal Medicine

## 2024-10-06 DIAGNOSIS — N644 Mastodynia: Secondary | ICD-10-CM

## 2024-10-06 DIAGNOSIS — Z1231 Encounter for screening mammogram for malignant neoplasm of breast: Secondary | ICD-10-CM

## 2024-10-10 ENCOUNTER — Ambulatory Visit

## 2024-10-11 ENCOUNTER — Other Ambulatory Visit: Payer: PPO

## 2024-10-11 ENCOUNTER — Other Ambulatory Visit

## 2024-10-11 DIAGNOSIS — E785 Hyperlipidemia, unspecified: Secondary | ICD-10-CM

## 2024-10-11 DIAGNOSIS — Z0000 Encounter for general adult medical examination without abnormal findings: Secondary | ICD-10-CM

## 2024-10-11 DIAGNOSIS — Z1329 Encounter for screening for other suspected endocrine disorder: Secondary | ICD-10-CM

## 2024-10-12 ENCOUNTER — Ambulatory Visit: Admitting: Internal Medicine

## 2024-10-12 ENCOUNTER — Encounter: Payer: Self-pay | Admitting: Internal Medicine

## 2024-10-12 VITALS — BP 110/62 | HR 73 | Ht 67.0 in | Wt 145.1 lb

## 2024-10-12 DIAGNOSIS — M6289 Other specified disorders of muscle: Secondary | ICD-10-CM

## 2024-10-12 DIAGNOSIS — K582 Mixed irritable bowel syndrome: Secondary | ICD-10-CM | POA: Diagnosis not present

## 2024-10-12 DIAGNOSIS — N816 Rectocele: Secondary | ICD-10-CM | POA: Diagnosis not present

## 2024-10-12 DIAGNOSIS — K5902 Outlet dysfunction constipation: Secondary | ICD-10-CM

## 2024-10-12 DIAGNOSIS — M9905 Segmental and somatic dysfunction of pelvic region: Secondary | ICD-10-CM

## 2024-10-12 LAB — COMPREHENSIVE METABOLIC PANEL WITH GFR
AG Ratio: 1.8 (calc) (ref 1.0–2.5)
ALT: 11 U/L (ref 6–29)
AST: 12 U/L (ref 10–35)
Albumin: 4.2 g/dL (ref 3.6–5.1)
Alkaline phosphatase (APISO): 31 U/L — ABNORMAL LOW (ref 37–153)
BUN/Creatinine Ratio: 19 (calc) (ref 6–22)
BUN: 11 mg/dL (ref 7–25)
CO2: 29 mmol/L (ref 20–32)
Calcium: 9.7 mg/dL (ref 8.6–10.4)
Chloride: 103 mmol/L (ref 98–110)
Creat: 0.58 mg/dL — ABNORMAL LOW (ref 0.60–1.00)
Globulin: 2.4 g/dL (ref 1.9–3.7)
Glucose, Bld: 103 mg/dL — ABNORMAL HIGH (ref 65–99)
Potassium: 5 mmol/L (ref 3.5–5.3)
Sodium: 139 mmol/L (ref 135–146)
Total Bilirubin: 0.5 mg/dL (ref 0.2–1.2)
Total Protein: 6.6 g/dL (ref 6.1–8.1)
eGFR: 97 mL/min/{1.73_m2}

## 2024-10-12 LAB — CBC WITH DIFFERENTIAL/PLATELET
Absolute Lymphocytes: 1280 {cells}/uL (ref 850–3900)
Absolute Monocytes: 495 {cells}/uL (ref 200–950)
Basophils Absolute: 60 {cells}/uL (ref 0–200)
Basophils Relative: 1.2 %
Eosinophils Absolute: 100 {cells}/uL (ref 15–500)
Eosinophils Relative: 2 %
HCT: 39.4 % (ref 35.9–46.0)
Hemoglobin: 13.2 g/dL (ref 11.7–15.5)
MCH: 32 pg (ref 27.0–33.0)
MCHC: 33.5 g/dL (ref 31.6–35.4)
MCV: 95.6 fL (ref 81.4–101.7)
MPV: 10 fL (ref 7.5–12.5)
Monocytes Relative: 9.9 %
Neutro Abs: 3065 {cells}/uL (ref 1500–7800)
Neutrophils Relative %: 61.3 %
Platelets: 301 10*3/uL (ref 140–400)
RBC: 4.12 Million/uL (ref 3.80–5.10)
RDW: 11.7 % (ref 11.0–15.0)
Total Lymphocyte: 25.6 %
WBC: 5 10*3/uL (ref 3.8–10.8)

## 2024-10-12 LAB — LIPID PANEL
Cholesterol: 190 mg/dL
HDL: 90 mg/dL
LDL Cholesterol (Calc): 82 mg/dL
Non-HDL Cholesterol (Calc): 100 mg/dL
Total CHOL/HDL Ratio: 2.1 (calc)
Triglycerides: 86 mg/dL

## 2024-10-12 LAB — TSH: TSH: 1.46 m[IU]/L (ref 0.40–4.50)

## 2024-10-12 NOTE — Patient Instructions (Addendum)
" °  VISIT SUMMARY: During your visit, we discussed your chronic irregular bowel habits, pelvic pressure, and urinary symptoms. We have developed a plan to manage these issues non-surgically for now.  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 We will manage these symptoms without surgery for now. -You are referred to pelvic floor physical therapy to help retrain and strengthen your muscles. -Increase your daily water intake to at least 48 ounces, preferably non-sugary beverages. -Start taking Benefiber to help with bowel regularity. 1 tablespoon daily -We discussed the possibility of future surgery if your symptoms worsen.   Patient Instructions for Rectocele  What is a Rectocele?   A rectocele is a bulging of the rectum into the back wall of the vagina. This happens when the tissue between the rectum and vagina becomes weakened. Many women have a rectocele without any symptoms, and it is often found during routine examinations.  Common Symptoms   You may experience:  - Difficulty emptying your bowels completely  - A feeling of blockage or incomplete evacuation  - Needing to strain during bowel movements  - A bulge or pressure in the vaginal area  - Needing to press on the vagina or perineum (area between vagina and anus) to help empty your bowels (called splinting or digitation)  - Constipation  Conservative Treatment Strategies   Most rectoceles can be managed without surgery. The following strategies are recommended as first-line treatment:  Bowel Management:  - Eat a diet high in fiber (fruits, vegetables, whole grains) or take fiber supplements like psyllium  - Drink plenty of water throughout the day  - Respond promptly when you feel the urge to have a bowel movement--don't delay  - Avoid straining and spending excessive time on the toilet  - Consider  using a footstool to elevate your feet while sitting on the toilet, which can help with bowel movements  - If needed, your doctor may recommend stool softeners or laxatives  Pelvic Floor Muscle Training:  - Pelvic floor exercises (Kegel exercises) can strengthen the muscles that support your pelvic organs  - Work with a specialized pelvic floor physical therapist who can teach you proper technique  - Practice exercises regularly--typically three sets daily  - Biofeedback therapy may be recommended to help you learn to coordinate your pelvic floor muscles properly during bowel movements  Lifestyle Modifications:  - Maintain a healthy weight, as excess weight increases pressure on pelvic organs  - Avoid heavy lifting when possible  - Treat chronic cough or constipation, which can worsen the condition  - Avoid high-impact exercises that increase abdominal pressure  When to Follow Up   Contact your healthcare provider if:  - Your symptoms worsen despite conservative treatment  - You develop new symptoms  - You have concerns about your treatment plan  Most women respond well to conservative management. Surgery is typically reserved for those who have tried these measures without improvement and have significant symptoms affecting their quality of life.  I appreciate the opportunity to care for you. Gabriela CHARLENA Commander, MD, [facility]    "

## 2024-10-12 NOTE — Progress Notes (Signed)
 "       Gabriela Green 74 y.o. Aug 28, 1951 [id number]  Assessment & Plan:   Encounter Diagnoses  Name Primary?   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 Nonoperative management preferred; surgery for refractory cases. - Referred to pelvic floor physical therapy for muscle retraining and strengthening. - Recommended daily fluid intake of at least 48 ounces of water,  - Advised initiation of Benefiber 1 tablespoon daily to try to improve bowel habit regularity. - Provided education on rectocele and pelvic floor dysfunction, including potential future surgery.  Return as needed after pelvic floor physical therapy  If persistent issues would consider breath testing to look for small intestinal bacterial overgrowth.  I appreciate the opportunity to care for this patient. CC: Gabriela Ronal PARAS, MD   Subjective:   Chief Complaint: Abdominal pain/pressure and constipation altered bowel habit issues  HPI  Discussed the use of AI scribe software for clinical note transcription with the patient, who gave verbal consent to proceed.   Gabriela Green is a 74 year old female with a history of hysterectomy, IBS and diverticulosis who presents for evaluation of chronic irregular bowel habits.  Last seen by me at colonoscopy 2019+ was seen in 2018 with IBS mixed  Altered Bowel Habits: - Chronic alternating constipation and unpredictable urgency - Constipation episodes last up to four days, occasionally up to a week during travel - Sudden urgency with minimal warning, resulting in two episodes of fecal incontinence in safe environments - Stool consistency varies from liquid to oily, with increased gas and occasional slight leakage - Loose stools and diarrhea typically follow periods of constipation - No use of supplements  or medications for bowel regulation - Recent improvement in regularity: bowel movements five out of seven days, typically in the morning, though stools remain non-solid - Historical pattern: bowel movements three to four times per week  Pelvic Pressure and Vaginal Sensations: - Intermittent nocturnal pelvic pressure over the past year, described as a sensation of impending delivery - Pressure involves rectal, vaginal, and pelvic areas; episodes are brief, non-painful, and sporadic (twice in a month or with a month between episodes) - Sensation of thickness inside the vagina, discussed with gynecologist within the last few months and previously about a year ago - Surgical intervention discussed as a future option if symptoms worsen, by gynecology  Genitourinary symptoms: - No urinary frequency; able to hold urine for extended periods - Occasional stress incontinence, such as leakage on the third sneeze, infrequent and not concerning - Dark yellow urine at times - No dyspareunia  Dietary Habits: - Breakfast: instant oatmeal, bacon, salmon with cream cheese - Lunch: salad, hamburger, or fast food - Dinner: square meal - Drinks one to two cups of coffee daily, red wine, and some soft drinks - Poor water intake  Functional Status: - Remains active in interior design business       Allergies[1] Active Medications[2] Past Medical History:  Diagnosis Date   Allergy    HSV-2 (herpes simplex virus 2) infection    IBS (irritable bowel syndrome)    Interstitial cystitis    Nephrolithiasis 06/2017   TMJ syndrome    Past Surgical History:  Procedure Laterality Date   ABDOMINAL HYSTERECTOMY     AUGMENTATION MAMMAPLASTY     replaced 2021  BREAST BIOPSY Right 09/30/2022   US  RT BREAST BX W LOC DEV 1ST LESION IMG BX SPEC US  GUIDE 09/30/2022 [facility]   BREAST ENHANCEMENT SURGERY     BREAST ENHANCEMENT SURGERY Bilateral    had reduction and implants, then later implants  removed and replaced   COLONOSCOPY  2009   MICRODISCECTOMY LUMBAR  10/1994   L5-S1   REDUCTION MAMMAPLASTY     Social History   Social History Narrative   Patient is divorced, remarried fall 2022. She has 2 daughters, with grandchildren. 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  No tobacco or drug use; alcohol 1 to 2 glasses of wine in the evenings. Some caffeine   family history includes Breast cancer in her maternal grandmother; Colon cancer (age of onset: 59) in her maternal grandmother; Diabetes in her father and sister; Heart attack in her father; Heart disease in her paternal aunt; Hyperlipidemia in her mother; Ovarian cancer in an other family member.   Review of Systems As per HPI  Objective:   Physical Exam BP 110/62   Pulse 73   Ht 5' 7 (1.702 m)   Wt 145 lb 2 oz (65.8 kg)   BMI 22.73 kg/m   Gabriela Green, CMA present.  Abd soft NT Anoderm inspection normal  Digital exam revealed normal resting tone and slightly reducedvoluntary squeeze. Moderate rectocele present. Simulated defecation with valsalva revealed appropriate abdominal contraction with paradoxical anal contraction and no descent       [1]  Allergies Allergen Reactions   Latex Swelling  [2]  Current Meds  Medication Sig   ALPRAZolam  (XANAX ) 0.25 MG tablet Take 1 tablet (0.25 mg total) by mouth 2 (two) times daily as needed for anxiety.   Apoaequorin (PREVAGEN) 10 MG CAPS Take by mouth.   Biotin 5000 MCG TABS Take 1 tablet by mouth daily.   Cholecalciferol (VITAMIN D ) 2000 units CAPS Take 1 capsule by mouth daily.   estradiol  (ESTRACE ) 1 MG tablet Take 1 tablet (1 mg total) by mouth daily.   Misc Natural Products (PROGESTERONE) 1000 MG/60GM CREA Apply 1 application topically daily.   "

## 2024-10-13 ENCOUNTER — Encounter: Payer: Self-pay | Admitting: Internal Medicine

## 2024-10-13 ENCOUNTER — Ambulatory Visit: Payer: PPO | Admitting: Internal Medicine

## 2024-10-13 VITALS — BP 102/80 | HR 67 | Ht 67.0 in | Wt 145.0 lb

## 2024-10-13 DIAGNOSIS — Z0000 Encounter for general adult medical examination without abnormal findings: Secondary | ICD-10-CM

## 2024-10-13 DIAGNOSIS — N816 Rectocele: Secondary | ICD-10-CM

## 2024-10-13 NOTE — Patient Instructions (Signed)
 Ms. Brandvold,  Thank you for taking the time for your Medicare Wellness Visit. I appreciate your continued commitment to your health goals. Please review the care plan we discussed, and feel free to reach out if I can assist you further.  Please note that Annual Wellness Visits do not include a physical exam. Some assessments may be limited, especially if the visit was conducted virtually. If needed, we may recommend an in-person follow-up with your provider.  Ongoing Care Seeing your primary care provider every 3 to 6 months helps us  monitor your health and provide consistent, personalized care.   Referrals If a referral was made during today's visit and you haven't received any updates within two weeks, please contact the referred provider directly to check on the status.  Recommended Screenings:  Health Maintenance  Topic Date Due   DTaP/Tdap/Td vaccine (3 - Td or Tdap) 10/05/2023   Medicare Annual Wellness Visit  10/12/2024   COVID-19 Vaccine (6 - 2025-26 season) 10/29/2024*   Breast Cancer Screening  10/06/2025   Colon Cancer Screening  05/13/2028   Pneumococcal Vaccine for age over 65  Completed   Flu Shot  Completed   Osteoporosis screening with Bone Density Scan  Completed   Meningitis B Vaccine  Aged Out   Hepatitis C Screening  Discontinued   Zoster (Shingles) Vaccine  Discontinued  *Topic was postponed. The date shown is not the original due date.       10/13/2024   12:25 PM  Advanced Directives  Does Patient Have a Medical Advance Directive? Yes  Type of Advance Directive Living will;Healthcare Power of Attorney  Does patient want to make changes to medical advance directive? No - Patient declined  Copy of Healthcare Power of Attorney in Chart? No - copy requested    Vision: Annual vision screenings are recommended for early detection of glaucoma, cataracts, and diabetic retinopathy. These exams can also reveal signs of chronic conditions such as diabetes and high blood  pressure.  Dental: Annual dental screenings help detect early signs of oral cancer, gum disease, and other conditions linked to overall health, including heart disease and diabetes.  Please see the attached documents for additional preventive care recommendations.

## 2024-10-13 NOTE — Progress Notes (Unsigned)
 "  Chief Complaint  Patient presents with   Annual Exam   Medicare Wellness     Subjective:   Gabriela Green is a 74 y.o. female who presents for a Medicare Annual Wellness Visit.  Visit info / Clinical Intake: Medicare Wellness Visit Type:: Subsequent Annual Wellness Visit Persons participating in visit and providing information:: patient Medicare Wellness Visit Mode:: In-person (required for WTM) Interpreter Needed?: No Pre-visit prep was completed: yes AWV questionnaire completed by patient prior to visit?: no Living arrangements:: lives with spouse/significant other Patient's Overall Health Status Rating: very good Typical amount of pain: some Does pain affect daily life?: no Are you currently prescribed opioids?: no  Dietary Habits and Nutritional Risks How many meals a day?: 3 Eats fruit and vegetables daily?: yes Most meals are obtained by: having others provide food In the last 2 weeks, have you had any of the following?: none Diabetic:: no  Functional Status Activities of Daily Living (to include ambulation/medication): Independent Ambulation: Independent Medication Administration: Independent Home Management (perform basic housework or laundry): Independent Manage your own finances?: yes Primary transportation is: driving Concerns about vision?: no *vision screening is required for WTM* Concerns about hearing?: no  Fall Screening Falls in the past year?: 0 Number of falls in past year: 0 Was there an injury with Fall?: 0 Fall Risk Category Calculator: 0 Patient Fall Risk Level: Low Fall Risk  Fall Risk Patient at Risk for Falls Due to: No Fall Risks Fall risk Follow up: Falls prevention discussed; Education provided; Falls evaluation completed  Home and Transportation Safety: All rugs have non-skid backing?: yes All stairs or steps have railings?: yes Grab bars in the bathtub or shower?: yes Have non-skid surface in bathtub or shower?: yes Good home  lighting?: yes Regular seat belt use?: yes Hospital stays in the last year:: no  Cognitive Assessment Difficulty concentrating, remembering, or making decisions? : no Will 6CIT or Mini Cog be Completed: no 6CIT or Mini Cog Declined: patient alert, oriented, able to answer questions appropriately and recall recent events  Advance Directives (For Healthcare) Does Patient Have a Medical Advance Directive?: Yes Does patient want to make changes to medical advance directive?: No - Patient declined Type of Advance Directive: Living will; Healthcare Power of Attorney Copy of Healthcare Power of Attorney in Chart?: No - copy requested Copy of Living Will in Chart?: No - copy requested  Reviewed/Updated  Reviewed/Updated: Reviewed All (Medical, Surgical, Family, Medications, Allergies, Care Teams, Patient Goals)    Allergies (verified) Latex   Current Medications (verified) Outpatient Encounter Medications as of 10/13/2024  Medication Sig   ALPRAZolam  (XANAX ) 0.25 MG tablet Take 1 tablet (0.25 mg total) by mouth 2 (two) times daily as needed for anxiety.   Apoaequorin (PREVAGEN) 10 MG CAPS Take by mouth.   Biotin 5000 MCG TABS Take 1 tablet by mouth daily.   Cholecalciferol (VITAMIN D ) 2000 units CAPS Take 1 capsule by mouth daily.   estradiol  (ESTRACE ) 1 MG tablet Take 1 tablet (1 mg total) by mouth daily.   Misc Natural Products (PROGESTERONE) 1000 MG/60GM CREA Apply 1 application topically daily.   triamcinolone cream (KENALOG) 0.1 % PLEASE SEE ATTACHED FOR DETAILED DIRECTIONS   [DISCONTINUED] HYDROcodone  bit-homatropine (HYCODAN) 5-1.5 MG/5ML syrup Take 5 mLs by mouth every 8 (eight) hours as needed for cough. (Patient not taking: Reported on 10/12/2024)   [DISCONTINUED] hyoscyamine  (LEVSIN  SL) 0.125 MG SL tablet DISSOLVE 1 TABLET UNDER THE TONGUE 30 MINUTES BEFORE MEALS AND AT BEDTIME AS  NEEDED FOR IRRITABLE BOWEL SYNDROME (Patient not taking: Reported on 10/12/2024)   No  facility-administered encounter medications on file as of 10/13/2024.    History: Past Medical History:  Diagnosis Date   Allergy    HSV-2 (herpes simplex virus 2) infection    IBS (irritable bowel syndrome)    Interstitial cystitis    Nephrolithiasis 06/2017   TMJ syndrome    Past Surgical History:  Procedure Laterality Date   ABDOMINAL HYSTERECTOMY     AUGMENTATION MAMMAPLASTY     replaced 2021   BREAST BIOPSY Right 09/30/2022   US  RT BREAST BX W LOC DEV 1ST LESION IMG BX SPEC US  GUIDE 09/30/2022 [facility]   BREAST ENHANCEMENT SURGERY     BREAST ENHANCEMENT SURGERY Bilateral    had reduction and implants, then later implants removed and replaced   COLONOSCOPY  2009   MICRODISCECTOMY LUMBAR  10/1994   L5-S1   REDUCTION MAMMAPLASTY     Family History  Problem Relation Age of Onset   Hyperlipidemia Mother    Heart attack Father    Diabetes Father    Diabetes Sister    Heart disease Paternal Aunt    Breast cancer Maternal Grandmother    Colon cancer Maternal Grandmother 60   Ovarian cancer Other        mat great aunt   Esophageal cancer Neg Hx    Rectal cancer Neg Hx    Stomach cancer Neg Hx    Social History   Occupational History   Occupation: social research officer, government    Comment: Self-employed  Tobacco Use   Smoking status: Never   Smokeless tobacco: Never  Vaping Use   Vaping status: Never Used  Substance and Sexual Activity   Alcohol use: Yes    Alcohol/week: 14.0 standard drinks of alcohol    Types: 14 Glasses of wine per week   Drug use: No   Sexual activity: Yes    Birth control/protection: Post-menopausal   Tobacco Counseling Counseling given: Not Answered  SDOH Screenings   Food Insecurity: No Food Insecurity (10/13/2024)  Housing: Low Risk (10/13/2024)  Transportation Needs: No Transportation Needs (10/13/2024)  Utilities: Not At Risk (10/13/2024)  Alcohol Screen: Low Risk (10/13/2023)  Depression (PHQ2-9): Low Risk (10/13/2023)  Financial Resource  Strain: Low Risk (10/13/2023)  Recent Concern: Financial Resource Strain - High Risk (10/13/2023)  Physical Activity: Sufficiently Active (10/13/2024)  Social Connections: Moderately Integrated (10/13/2024)  Stress: No Stress Concern Present (10/13/2024)  Tobacco Use: Low Risk (10/13/2024)  Health Literacy: Adequate Health Literacy (10/13/2024)   See flowsheets for full screening details  Depression Screen     Goals Addressed               This Visit's Progress     Activity and Exercise Increased (pt-stated)        Evidence-based guidance:  Review current exercise levels.  Assess patient perspective on exercise or activity level, barriers to increasing activity, motivation and readiness for change.  Recommend or set healthy exercise goal based on individual tolerance.  Encourage small steps toward making change in amount of exercise or activity.  Urge reduction of sedentary activities or screen time.  Promote group activities within the community or with family or support person.  Consider referral to rehabiliation therapist for assessment and exercise/activity plan.   Notes:              Objective:    Today's Vitals   10/13/24 1103  BP: 102/80  Pulse: 67  SpO2: 97%  Weight: 145 lb (65.8 kg)  Height: 5' 7 (1.702 m)   Body mass index is 22.71 kg/m.  Hearing/Vision screen Vision Screening - Comments:: Patient states she is up to date with her yearly eye exam.  Immunizations and Health Maintenance Health Maintenance  Topic Date Due   DTaP/Tdap/Td (3 - Td or Tdap) 10/05/2023   COVID-19 Vaccine (6 - 2025-26 season) 10/29/2024 (Originally 05/09/2024)   Mammogram  10/06/2025   Medicare Annual Wellness (AWV)  10/13/2025   Colonoscopy  05/13/2028   Pneumococcal Vaccine: 50+ Years  Completed   Influenza Vaccine  Completed   Bone Density Scan  Completed   Meningococcal B Vaccine  Aged Out   Hepatitis C Screening  Discontinued   Zoster Vaccines- Shingrix  Discontinued         Assessment/Plan:  This is a routine wellness examination for [place].  Patient Care Team: [name], MD as PCP - General (Internal Medicine)  I have personally reviewed and noted the following in the patients chart:   Medical and social history Use of alcohol, tobacco or illicit drugs  Current medications and supplements including opioid prescriptions. Functional ability and status Nutritional status Physical activity Advanced directives List of other physicians Hospitalizations, surgeries, and ER visits in previous 12 months Vitals Screenings to include cognitive, depression, and falls Referrals and appointments  No orders of the defined types were placed in this encounter.  In addition, I have reviewed and discussed with patient certain preventive protocols, quality metrics, and best practice recommendations. A written personalized care plan for preventive services as well as general preventive health recommendations were provided to patient.   Megen Madewell, CMA   10/13/2024   Return in 1 year (on 10/13/2025).  After Visit Summary: (In Person-Printed) AVS printed and given to the patient  Nurse Notes: NONE "

## 2024-10-14 ENCOUNTER — Ambulatory Visit

## 2024-10-14 ENCOUNTER — Ambulatory Visit: Admission: RE | Admit: 2024-10-14 | Source: Ambulatory Visit

## 2024-10-14 ENCOUNTER — Other Ambulatory Visit: Payer: Self-pay | Admitting: Internal Medicine

## 2024-10-14 DIAGNOSIS — N644 Mastodynia: Secondary | ICD-10-CM

## 2024-11-30 ENCOUNTER — Ambulatory Visit

## 2025-10-16 ENCOUNTER — Other Ambulatory Visit

## 2025-10-19 ENCOUNTER — Ambulatory Visit: Admitting: Internal Medicine
